# Patient Record
Sex: Male | Born: 1985
Health system: Southern US, Community
[De-identification: ages and names within clinical notes are randomized; demographics above are authoritative.]

## PROBLEM LIST (undated history)

## (undated) DIAGNOSIS — K219 Gastro-esophageal reflux disease without esophagitis: Secondary | ICD-10-CM

## (undated) DIAGNOSIS — E7800 Pure hypercholesterolemia, unspecified: Secondary | ICD-10-CM

## (undated) DIAGNOSIS — F32A Depression, unspecified: Secondary | ICD-10-CM

## (undated) DIAGNOSIS — F329 Major depressive disorder, single episode, unspecified: Secondary | ICD-10-CM

## (undated) DIAGNOSIS — E559 Vitamin D deficiency, unspecified: Secondary | ICD-10-CM

## (undated) DIAGNOSIS — E039 Hypothyroidism, unspecified: Secondary | ICD-10-CM

---

## 2014-04-14 HISTORY — PX: UPPER GASTROINTESTINAL ENDOSCOPY: SHX188

## 2014-05-03 ENCOUNTER — Encounter: Payer: Self-pay | Admitting: Internal Medicine

## 2017-12-28 ENCOUNTER — Encounter: Payer: Self-pay | Admitting: Pharmacist

## 2017-12-28 ENCOUNTER — Other Ambulatory Visit: Payer: Self-pay | Admitting: Pharmacist

## 2017-12-28 ENCOUNTER — Telehealth: Payer: Self-pay | Admitting: Pharmacist

## 2017-12-28 DIAGNOSIS — Z79899 Other long term (current) drug therapy: Secondary | ICD-10-CM

## 2017-12-28 MED ORDER — DUPILUMAB 300 MG/2ML ~~LOC~~ SOSY: 1.0000 | PREFILLED_SYRINGE | SUBCUTANEOUS | 11 refills | Status: DC

## 2017-12-28 NOTE — Telephone Encounter (Signed)
@  LOGO@  S: Patient presents to [facility] for review of their specialty medication therapy.  Patient is currently taking Dupixent for atopic dermatitis. Patient is managed by Kizzie Fantasia for this.   Adherence: missed last dose because he ran out  Efficacy: it was working well when he was able to take the medication  Dosing: 300 mg every 2 weeks  Dose adjustments: Renal: no dose adjustments (has not been studied) Hepatic: no dose adjustments (has not been studied)  Drug-drug interactions: none  Monitoring: Hypersensitivity: denies Ocular adverse effects: denies S/sx of infection: denies S/sx of eosinophilia: denies   O: @FLOWAMB (5)@   No results found for: WBC, HGB, HCT, MCV, PLT  @LASTCHEM @  A/P: 1. Medication review: Patient currently on [place] for eczema. Reviewed the medication with the patient, including the following:  Dupixent is a human monoclonal antibody that inhibits IL-4 when then prevents or decreases inflammatory response. Monitor for hypersensitivity, ocular adverse effects, eosinophilia, and increased risk of infection. Avoid live vaccinations. No recommendations for any changes.    Christella Hartigan, PharmD, BCPS, BCACP, [facility] [phone]

## 2017-12-28 NOTE — Progress Notes (Unsigned)
@  LOGO@  S: Patient presents to [facility] for review of their specialty medication therapy.  Patient is currently taking Dupixent for atopic dermatitis. Patient is managed by Kizzie Fantasia for this.   Adherence: missed last dose because he ran out  Efficacy: it was working well when he was able to take the medication  Dosing: 300 mg every 2 weeks  Dose adjustments: Renal: no dose adjustments (has not been studied) Hepatic: no dose adjustments (has not been studied)  Drug-drug interactions: none  Monitoring: Hypersensitivity: denies Ocular adverse effects: denies S/sx of infection: denies S/sx of eosinophilia: denies   O: @FLOWAMB (5)@   RecentLabs  No results found for: WBC, HGB, HCT, MCV, PLT    @LASTCHEM @  A/P: 1. Medication review: Patient currently on [place] for eczema. Reviewed the medication with the patient, including the following:  Dupixent is a human monoclonal antibody that inhibits IL-4 when then prevents or decreases inflammatory response. Monitor for hypersensitivity, ocular adverse effects, eosinophilia, and increased risk of infection. Avoid live vaccinations. No recommendations for any changes.    Christella Hartigan, PharmD, BCPS, BCACP, [facility] [phone]

## 2017-12-28 NOTE — Progress Notes (Signed)
@  LOGO@  S: Patient presents to [facility] for review of their specialty medication therapy.  Patient is currently taking Dupixent for atopic dermatitis. Patient is managed by Kizzie Fantasia for this.   Adherence: missed last dose because he ran out  Efficacy: it was working well when he was able to take the medication  Dosing: 300 mg every 2 weeks  Dose adjustments: Renal: no dose adjustments (has not been studied) Hepatic: no dose adjustments (has not been studied)  Drug-drug interactions: none  Monitoring: Hypersensitivity: denies Ocular adverse effects: denies S/sx of infection: denies S/sx of eosinophilia: denies   O: @FLOWAMB (5)@   RecentLabs  No results found for: WBC, HGB, HCT, MCV, PLT    @LASTCHEM @  A/P: 1. Medication review: Patient currently on [place] for eczema. Reviewed the medication with the patient, including the following:  Dupixent is a human monoclonal antibody that inhibits IL-4 when then prevents or decreases inflammatory response. Monitor for hypersensitivity, ocular adverse effects, eosinophilia, and increased risk of infection. Avoid live vaccinations. No recommendations for any changes.    Christella Hartigan, PharmD, BCPS, BCACP, [facility] [phone]

## 2018-01-18 MED FILL — BUPROPION HCL XL 300 MG TAB: 300 | 90 days supply | Qty: 90 | Fill #0 | Status: TO

## 2018-01-18 MED FILL — ESCITALOPRAM 10 MG TABLET: 10 | 30 days supply | Qty: 30 | Fill #0

## 2018-01-18 MED FILL — LEVOTHYROXINE 100 MCG TAB: 100 | 30 days supply | Qty: 30 | Fill #0

## 2018-01-27 DIAGNOSIS — M62830 Muscle spasm of back: Secondary | ICD-10-CM | POA: Diagnosis not present

## 2018-01-27 DIAGNOSIS — M542 Cervicalgia: Secondary | ICD-10-CM | POA: Diagnosis not present

## 2018-02-03 DIAGNOSIS — D2339 Other benign neoplasm of skin of other parts of face: Secondary | ICD-10-CM | POA: Diagnosis not present

## 2018-02-03 DIAGNOSIS — D485 Neoplasm of uncertain behavior of skin: Secondary | ICD-10-CM | POA: Diagnosis not present

## 2018-02-03 DIAGNOSIS — Z79899 Other long term (current) drug therapy: Secondary | ICD-10-CM | POA: Diagnosis not present

## 2018-02-03 DIAGNOSIS — L2089 Other atopic dermatitis: Secondary | ICD-10-CM | POA: Diagnosis not present

## 2018-04-16 MED FILL — BUPROPION HCL XL 300 MG TAB: 300 | 30 days supply | Qty: 30 | Fill #0

## 2018-04-21 DIAGNOSIS — Z0001 Encounter for general adult medical examination with abnormal findings: Secondary | ICD-10-CM | POA: Diagnosis not present

## 2018-04-21 DIAGNOSIS — E669 Obesity, unspecified: Secondary | ICD-10-CM | POA: Diagnosis not present

## 2018-04-21 DIAGNOSIS — E039 Hypothyroidism, unspecified: Secondary | ICD-10-CM | POA: Diagnosis not present

## 2018-04-21 DIAGNOSIS — K219 Gastro-esophageal reflux disease without esophagitis: Secondary | ICD-10-CM | POA: Diagnosis not present

## 2018-04-21 DIAGNOSIS — Z1389 Encounter for screening for other disorder: Secondary | ICD-10-CM | POA: Diagnosis not present

## 2018-04-21 DIAGNOSIS — F341 Dysthymic disorder: Secondary | ICD-10-CM | POA: Diagnosis not present

## 2018-04-21 MED FILL — OMEPRAZOLE 20 MG CAP: 20 | 45 days supply | Qty: 90 | Fill #0

## 2018-04-21 MED FILL — ESCITALOPRAM 10 MG TABLET: 10 | 90 days supply | Qty: 90 | Fill #0

## 2018-04-27 MED FILL — ROSUVASTATIN CALCIUM 10 MG: 10 | 90 days supply | Qty: 90 | Fill #0

## 2018-05-09 DIAGNOSIS — J329 Chronic sinusitis, unspecified: Secondary | ICD-10-CM | POA: Diagnosis not present

## 2018-05-09 DIAGNOSIS — R05 Cough: Secondary | ICD-10-CM | POA: Diagnosis not present

## 2018-05-09 DIAGNOSIS — R0602 Shortness of breath: Secondary | ICD-10-CM | POA: Diagnosis not present

## 2018-05-25 MED FILL — BUPROPION HCL XL 300 MG TAB: 300 | 90 days supply | Qty: 90 | Fill #0

## 2018-07-26 MED FILL — ESCITALOPRAM 10 MG TABLET: 10 | 90 days supply | Qty: 90 | Fill #1

## 2018-07-26 MED FILL — OMEPRAZOLE 20 MG CAP: 20 | 45 days supply | Qty: 90 | Fill #1

## 2018-07-26 MED FILL — ROSUVASTATIN CALCIUM 10 MG: 10 | 90 days supply | Qty: 90 | Fill #1

## 2018-08-08 DIAGNOSIS — E7800 Pure hypercholesterolemia, unspecified: Secondary | ICD-10-CM | POA: Diagnosis not present

## 2018-08-08 DIAGNOSIS — R072 Precordial pain: Secondary | ICD-10-CM | POA: Diagnosis not present

## 2018-08-08 DIAGNOSIS — Z882 Allergy status to sulfonamides status: Secondary | ICD-10-CM | POA: Diagnosis not present

## 2018-08-08 DIAGNOSIS — R079 Chest pain, unspecified: Secondary | ICD-10-CM | POA: Diagnosis not present

## 2018-08-09 DIAGNOSIS — Z882 Allergy status to sulfonamides status: Secondary | ICD-10-CM | POA: Diagnosis not present

## 2018-08-09 DIAGNOSIS — R079 Chest pain, unspecified: Secondary | ICD-10-CM | POA: Diagnosis not present

## 2018-08-09 DIAGNOSIS — E7800 Pure hypercholesterolemia, unspecified: Secondary | ICD-10-CM | POA: Diagnosis not present

## 2018-08-25 MED FILL — BuPROPion HCL ER (XL) 300 M: 300 | 90 days supply | Qty: 90 | Fill #1

## 2018-10-04 DIAGNOSIS — A084 Viral intestinal infection, unspecified: Secondary | ICD-10-CM | POA: Diagnosis not present

## 2018-10-18 DIAGNOSIS — E039 Hypothyroidism, unspecified: Secondary | ICD-10-CM | POA: Diagnosis not present

## 2018-10-18 DIAGNOSIS — E559 Vitamin D deficiency, unspecified: Secondary | ICD-10-CM | POA: Diagnosis not present

## 2018-10-18 DIAGNOSIS — R7989 Other specified abnormal findings of blood chemistry: Secondary | ICD-10-CM | POA: Diagnosis not present

## 2018-10-20 DIAGNOSIS — Z713 Dietary counseling and surveillance: Secondary | ICD-10-CM | POA: Diagnosis not present

## 2018-10-20 DIAGNOSIS — R197 Diarrhea, unspecified: Secondary | ICD-10-CM | POA: Diagnosis not present

## 2018-10-20 DIAGNOSIS — Z6832 Body mass index (BMI) 32.0-32.9, adult: Secondary | ICD-10-CM | POA: Diagnosis not present

## 2018-10-20 DIAGNOSIS — E781 Pure hyperglyceridemia: Secondary | ICD-10-CM | POA: Diagnosis not present

## 2018-10-20 DIAGNOSIS — E039 Hypothyroidism, unspecified: Secondary | ICD-10-CM | POA: Diagnosis not present

## 2018-10-21 MED FILL — LEVOTHYROXINE 112 MCG TAB: 112 | 30 days supply | Qty: 30 | Fill #0

## 2018-10-21 MED FILL — ESCITALOPRAM 10 MG TABLET: 10 | 30 days supply | Qty: 30 | Fill #0

## 2018-10-21 MED FILL — ROSUVASTATIN CALCIUM 10 MG: 10 | 90 days supply | Qty: 90 | Fill #0

## 2018-10-21 MED FILL — OMEPRAZOLE 20 MG CPDR: 20 | 30 days supply | Qty: 60 | Fill #0

## 2018-11-19 MED FILL — LEVOTHYROXINE 112 MCG TAB: 112 | 30 days supply | Qty: 30 | Fill #1

## 2018-11-19 MED FILL — buPROPion HCL ER (XL) 300 M: 300 | 30 days supply | Qty: 30 | Fill #0

## 2018-12-01 DIAGNOSIS — Z79899 Other long term (current) drug therapy: Secondary | ICD-10-CM | POA: Diagnosis not present

## 2018-12-01 DIAGNOSIS — L298 Other pruritus: Secondary | ICD-10-CM | POA: Diagnosis not present

## 2018-12-01 DIAGNOSIS — L2089 Other atopic dermatitis: Secondary | ICD-10-CM | POA: Diagnosis not present

## 2018-12-01 MED FILL — CLOBETASOL PROPIONATE 0.05: 0.05 | 30 days supply | Qty: 1 | Fill #0

## 2018-12-21 MED FILL — buPROPion HCL ER (XL) 300 M: 300 | 30 days supply | Qty: 30 | Fill #1

## 2018-12-21 MED FILL — LEVOTHYROXINE 112 MCG TAB: 112 | 30 days supply | Qty: 30 | Fill #2

## 2018-12-21 MED FILL — OMEPRAZOLE 20 MG CPDR: 20 | 30 days supply | Qty: 60 | Fill #1

## 2018-12-28 ENCOUNTER — Emergency Department (HOSPITAL_COMMUNITY): Payer: 59

## 2018-12-28 ENCOUNTER — Other Ambulatory Visit: Payer: Self-pay

## 2018-12-28 ENCOUNTER — Encounter (HOSPITAL_COMMUNITY): Payer: Self-pay | Admitting: *Deleted

## 2018-12-28 ENCOUNTER — Emergency Department (HOSPITAL_COMMUNITY)
Admission: EM | Admit: 2018-12-28 | Discharge: 2018-12-29 | Disposition: A | Discharge status: Home / Self Care | Payer: 59 | Attending: Emergency Medicine | Admitting: Emergency Medicine

## 2018-12-28 DIAGNOSIS — R1031 Right lower quadrant pain: Secondary | ICD-10-CM

## 2018-12-28 DIAGNOSIS — A084 Viral intestinal infection, unspecified: Secondary | ICD-10-CM | POA: Insufficient documentation

## 2018-12-28 DIAGNOSIS — E039 Hypothyroidism, unspecified: Secondary | ICD-10-CM | POA: Diagnosis not present

## 2018-12-28 DIAGNOSIS — Z79899 Other long term (current) drug therapy: Secondary | ICD-10-CM | POA: Diagnosis not present

## 2018-12-28 HISTORY — DX: Depression, unspecified: F32.A

## 2018-12-28 HISTORY — DX: Vitamin D deficiency, unspecified: E55.9

## 2018-12-28 HISTORY — DX: Major depressive disorder, single episode, unspecified: F32.9

## 2018-12-28 HISTORY — DX: Pure hypercholesterolemia, unspecified: E78.00

## 2018-12-28 HISTORY — DX: Hypothyroidism, unspecified: E03.9

## 2018-12-28 HISTORY — DX: Gastro-esophageal reflux disease without esophagitis: K21.9

## 2018-12-28 LAB — URINALYSIS, ROUTINE W REFLEX MICROSCOPIC
Bilirubin Urine: NEGATIVE
Glucose, UA: NEGATIVE mg/dL
Hgb urine dipstick: NEGATIVE
Ketones, ur: NEGATIVE mg/dL
Leukocytes, UA: NEGATIVE
NITRITE: NEGATIVE
Protein, ur: NEGATIVE mg/dL
Specific Gravity, Urine: 1.021 (ref 1.005–1.030)
pH: 6 (ref 5.0–8.0)

## 2018-12-28 LAB — CBC
HCT: 44.2 % (ref 39.0–52.0)
Hemoglobin: 14.6 g/dL (ref 13.0–17.0)
MCH: 28.2 pg (ref 26.0–34.0)
MCHC: 33 g/dL (ref 30.0–36.0)
MCV: 85.5 fL (ref 80.0–100.0)
Platelets: 279 10*3/uL (ref 150–400)
RBC: 5.17 MIL/uL (ref 4.22–5.81)
RDW: 12 % (ref 11.5–15.5)
WBC: 8.8 10*3/uL (ref 4.0–10.5)
nRBC: 0 % (ref 0.0–0.2)

## 2018-12-28 LAB — COMPREHENSIVE METABOLIC PANEL
ALT: 34 U/L (ref 0–44)
AST: 24 U/L (ref 15–41)
Albumin: 4.8 g/dL (ref 3.5–5.0)
Alkaline Phosphatase: 70 U/L (ref 38–126)
Anion gap: 7 (ref 5–15)
BUN: 14 mg/dL (ref 6–20)
CHLORIDE: 103 mmol/L (ref 98–111)
CO2: 26 mmol/L (ref 22–32)
CREATININE: 1.04 mg/dL (ref 0.61–1.24)
Calcium: 9.3 mg/dL (ref 8.9–10.3)
Glucose, Bld: 98 mg/dL (ref 70–99)
POTASSIUM: 4.1 mmol/L (ref 3.5–5.1)
SODIUM: 136 mmol/L (ref 135–145)
Total Bilirubin: 0.6 mg/dL (ref 0.3–1.2)
Total Protein: 7.5 g/dL (ref 6.5–8.1)

## 2018-12-28 LAB — LIPASE, BLOOD: LIPASE: 31 U/L (ref 11–51)

## 2018-12-28 MED ORDER — IOHEXOL 300 MG/ML  SOLN
100.0000 mL | Freq: Once | INTRAMUSCULAR | Status: AC | PRN
  Administered 2018-12-29: 100 mL via INTRAVENOUS

## 2018-12-28 MED ORDER — ONDANSETRON HCL 4 MG/2ML IJ SOLN
4.0000 mg | Freq: Once | INTRAMUSCULAR | Status: AC
  Administered 2018-12-29: 4 mg via INTRAVENOUS
  Filled 2018-12-28: qty 2

## 2018-12-28 MED ORDER — MORPHINE SULFATE (PF) 4 MG/ML IV SOLN
4.0000 mg | Freq: Once | INTRAVENOUS | Status: AC
  Administered 2018-12-29: 4 mg via INTRAVENOUS
  Filled 2018-12-28: qty 1

## 2018-12-28 NOTE — ED Triage Notes (Signed)
Pt c/o lower right abdominal pain and states the pain is worse after he eats; pt describes the pain as sharp and throbbing; pt states he woke up this am with the pain; pt states he has had n/d but no vomiting

## 2018-12-29 DIAGNOSIS — E039 Hypothyroidism, unspecified: Secondary | ICD-10-CM | POA: Diagnosis not present

## 2018-12-29 DIAGNOSIS — A084 Viral intestinal infection, unspecified: Secondary | ICD-10-CM | POA: Diagnosis not present

## 2018-12-29 DIAGNOSIS — Z79899 Other long term (current) drug therapy: Secondary | ICD-10-CM | POA: Diagnosis not present

## 2018-12-29 DIAGNOSIS — R1031 Right lower quadrant pain: Secondary | ICD-10-CM | POA: Diagnosis not present

## 2018-12-29 MED ORDER — DICYCLOMINE HCL 10 MG PO CAPS
20.0000 mg | ORAL_CAPSULE | Freq: Once | ORAL | Status: AC
  Administered 2018-12-29: 20 mg via ORAL
  Filled 2018-12-29: qty 2

## 2018-12-29 MED ORDER — DICYCLOMINE HCL 20 MG PO TABS: 20.0000 mg | ORAL_TABLET | Freq: Two times a day (BID) | ORAL | 0 refills | Status: DC | PRN

## 2018-12-29 NOTE — Discharge Instructions (Addendum)
Your CT scan and labs tonight are stable, I suspect you have a viral infection causing your abdominal pain and diarrhea.  Make sure you are drinking plenty of fluids, maintain a bland diet as discussed.  You may continue to take the Bentyl to help with cramping and pain.  I would allow the diarrhea to run its course as discussed, unless it becomes much more frequent such as diarrhea every hour which can result in significant dehydration and electrolyte abnormalities.  Obtain follow-up care as discussed and as recommended below.

## 2018-12-29 NOTE — ED Provider Notes (Signed)
[facility] EMERGENCY DEPARTMENT Provider Note   CSN: [id number] Arrival date & time: 12/28/18  2009     History   Chief Complaint Chief Complaint  Patient presents with  . Abdominal Pain    HPI William Lucero is a 33 y.o. male with a history significant for GERD, presenting with a one day history of right sided abdominal pain in association with 3-4 episodes of nonbloody diarrhea.  He woke this am with waxing/waning sharp and crampy pain in his right upper and lower abdomen which is worsened with movement and palpation but constant even at rest.  He denies n/v or anorexia and has been able to maintain PO intake.  Denies fevers, chills, dysuria, hematuria, flank pain.  He has had no tx prior to arrival.  The history is provided by the patient.    Past Medical History:  Diagnosis Date  . Acid reflux   . Depression   . Hypercholesteremia   . Hypothyroidism   . Vitamin D deficiency     There are no active problems to display for this patient.   History reviewed. No pertinent surgical history.      Home Medications    Prior to Admission medications   Medication Sig Start Date End Date Taking? Authorizing Provider  buPROPion (WELLBUTRIN XL) 300 MG 24 hr tablet Take 300 mg by mouth daily.   Yes [provider]  Cholecalciferol (DIALYVITE VITAMIN D 5000) 125 MCG (5000 UT) capsule Take 5,000 Units by mouth daily.   Yes [provider]  clobetasol cream (TEMOVATE) 3.22 % Apply 1 application topically daily as needed (skin irritation).   Yes [provider]  escitalopram (LEXAPRO) 10 MG tablet Take 10 mg by mouth daily.   Yes [provider]  Lactobacillus (ACIDOPHILUS PO) Take 1 capsule by mouth daily.   Yes [provider]  levothyroxine (SYNTHROID, LEVOTHROID) 112 MCG tablet Take 112 mcg by mouth daily before breakfast.   Yes [provider]  Multiple Vitamin (MULTIVITAMIN WITH MINERALS) TABS tablet Take 1 tablet by mouth  daily.   Yes [provider]  niacin 500 MG tablet Take 500 mg by mouth 2 (two) times daily with a meal.   Yes [provider]  omeprazole (PRILOSEC) 20 MG capsule Take 20 mg by mouth daily.   Yes [provider]  dicyclomine (BENTYL) 20 MG tablet Take 1 tablet (20 mg total) by mouth 2 (two) times daily as needed for spasms. 12/29/18   Evalee Jefferson, PA-C    Family History History reviewed. No pertinent family history.  Social History Social History   Tobacco Use  . Smoking status: Never Smoker  . Smokeless tobacco: Never Used  Substance Use Topics  . Alcohol use: Yes    Comment: socially  . Drug use: Never     Allergies   Sulfa antibiotics   Review of Systems Review of Systems  Constitutional: Negative for chills and fever.  HENT: Negative for congestion and sore throat.   Eyes: Negative.   Respiratory: Negative for chest tightness and shortness of breath.   Cardiovascular: Negative for chest pain.  Gastrointestinal: Positive for abdominal pain and diarrhea. Negative for blood in stool, nausea and vomiting.  Genitourinary: Negative.   Musculoskeletal: Negative for arthralgias, joint swelling and neck pain.  Skin: Negative.  Negative for rash and wound.  Neurological: Negative for dizziness, weakness, light-headedness, numbness and headaches.  Psychiatric/Behavioral: Negative.      Physical Exam Updated Vital Signs BP 130/80  Pulse 78   Temp 98.2 F (36.8 C) (Oral)   Resp 17   Ht 5\' 10"  (1.778 m)   Wt 99.8 kg   SpO2 97%   BMI 31.57 kg/m   Physical Exam Vitals signs and nursing note reviewed.  Constitutional:      Appearance: He is well-developed.  HENT:     Head: Normocephalic and atraumatic.  Eyes:     Conjunctiva/sclera: Conjunctivae normal.  Neck:     Musculoskeletal: Normal range of motion.  Cardiovascular:     Rate and Rhythm: Normal rate and regular rhythm.     Heart sounds: Normal heart sounds.  Pulmonary:      Effort: Pulmonary effort is normal.     Breath sounds: Normal breath sounds. No wheezing.  Abdominal:     General: Bowel sounds are normal.     Palpations: Abdomen is soft.     Tenderness: There is abdominal tenderness in the right upper quadrant and right lower quadrant. There is no right CVA tenderness, left CVA tenderness or rebound. Positive signs include McBurney's sign. Negative signs include psoas sign.  Musculoskeletal: Normal range of motion.  Skin:    General: Skin is warm and dry.  Neurological:     Mental Status: He is alert.      ED Treatments / Results  Labs (all labs ordered are listed, but only abnormal results are displayed) Labs Reviewed  LIPASE, BLOOD  COMPREHENSIVE METABOLIC PANEL  CBC  URINALYSIS, ROUTINE W REFLEX MICROSCOPIC    EKG None  Radiology Ct Abdomen Pelvis W Contrast  Result Date: 12/29/2018 CLINICAL DATA:  Right lower abdominal pain worse after eating. EXAM: CT ABDOMEN AND PELVIS WITH CONTRAST TECHNIQUE: Multidetector CT imaging of the abdomen and pelvis was performed using the standard protocol following bolus administration of intravenous contrast. CONTRAST:  143mL OMNIPAQUE IOHEXOL 300 MG/ML  SOLN COMPARISON:  None. FINDINGS: Lower chest: Atelectasis in the lung bases. Hepatobiliary: No focal liver abnormality is seen. No gallstones, gallbladder wall thickening, or biliary dilatation. Pancreas: Unremarkable. No pancreatic ductal dilatation or surrounding inflammatory changes. Spleen: Normal in size without focal abnormality. Adrenals/Urinary Tract: Adrenal glands are unremarkable. Kidneys are normal, without renal calculi, focal lesion, or hydronephrosis. Bladder is unremarkable. Stomach/Bowel: Stomach is within normal limits. Appendix appears normal. No evidence of bowel wall thickening, distention, or inflammatory changes. Vascular/Lymphatic: No significant vascular findings are present. No enlarged abdominal or pelvic lymph nodes. Reproductive:  Prostate is unremarkable. Other: No abdominal wall hernia or abnormality. No abdominopelvic ascites. Musculoskeletal: No acute or significant osseous findings. IMPRESSION: No acute process demonstrated in the abdomen or pelvis. No evidence of bowel obstruction or inflammation. Electronically Signed   By: Lucienne Capers M.D.   On: 12/29/2018 00:51    Procedures Procedures (including critical care time)  Medications Ordered in ED Medications  ondansetron (ZOFRAN) injection 4 mg (4 mg Intravenous Given 12/29/18 0011)  morphine 4 MG/ML injection 4 mg (4 mg Intravenous Given 12/29/18 0012)  iohexol (OMNIPAQUE) 300 MG/ML solution 100 mL (100 mLs Intravenous Contrast Given 12/29/18 0031)  dicyclomine (BENTYL) capsule 20 mg (20 mg Oral Given 12/29/18 0132)     Initial Impression / Assessment and Plan / ED Course  I have reviewed the triage vital signs and the nursing notes.  Pertinent labs & imaging results that were available during my care of the patient were reviewed by me and considered in my medical decision making (see chart for details).     Patient has normal labs and CT  imaging reviewed and also normal.  I suspect this is a viral enteritis.  He has had no diarrhea during his visit here and he tolerated p.o. intake without increased symptoms.  He was prescribed Bentyl for cramping relief.  Discussed bland diet and close follow-up with his PCP if symptoms persist, return here for any worsening symptoms.  He expressed concerns over chronic intermittent GI complaints including BMs triggered by any food intake which is been a symptom for many years.  I referred him to GI for further evaluation of his chronic symptoms.  The patient appears reasonably screened and/or stabilized for discharge and I doubt any other medical condition or other [facility] requiring further screening, evaluation, or treatment in the ED at this time prior to discharge.   Final Clinical Impressions(s) / ED Diagnoses   Final  diagnoses:  Viral enteritis  Right lower quadrant abdominal pain    ED Discharge Orders         Ordered    dicyclomine (BENTYL) 20 MG tablet  2 times daily PRN     12/29/18 0117           Evalee Jefferson, PA-C 12/30/18 1042    Rancour, Annie Main, MD 12/30/18 1707

## 2019-01-18 ENCOUNTER — Encounter (INDEPENDENT_AMBULATORY_CARE_PROVIDER_SITE_OTHER): Payer: Self-pay | Admitting: Internal Medicine

## 2019-01-18 ENCOUNTER — Encounter (INDEPENDENT_AMBULATORY_CARE_PROVIDER_SITE_OTHER): Payer: Self-pay | Admitting: *Deleted

## 2019-01-18 ENCOUNTER — Ambulatory Visit (INDEPENDENT_AMBULATORY_CARE_PROVIDER_SITE_OTHER): Payer: 59 | Admitting: Internal Medicine

## 2019-01-18 VITALS — BP 129/85 | HR 92 | Temp 98.1°F | Resp 18 | Ht 71.0 in | Wt 225.4 lb

## 2019-01-18 DIAGNOSIS — R1011 Right upper quadrant pain: Secondary | ICD-10-CM | POA: Diagnosis not present

## 2019-01-18 DIAGNOSIS — K219 Gastro-esophageal reflux disease without esophagitis: Secondary | ICD-10-CM | POA: Diagnosis not present

## 2019-01-18 DIAGNOSIS — K580 Irritable bowel syndrome with diarrhea: Secondary | ICD-10-CM

## 2019-01-18 MED ORDER — PANTOPRAZOLE SODIUM 40 MG PO TBEC: 40.0000 mg | DELAYED_RELEASE_TABLET | Freq: Every day | ORAL | 5 refills | Status: DC

## 2019-01-18 MED ORDER — DICYCLOMINE HCL 10 MG PO CAPS: 10.0000 mg | ORAL_CAPSULE | Freq: Three times a day (TID) | ORAL | 2 refills | Status: DC

## 2019-01-18 MED FILL — PANTOPRAZOLE SOD DR 40 MG T: 40 | 30 days supply | Qty: 30 | Fill #0

## 2019-01-18 MED FILL — DICYCLOMINE 10 MG CAPSULE: 10 | 30 days supply | Qty: 90 | Fill #0

## 2019-01-18 NOTE — Patient Instructions (Signed)
Physician will call with results of ultrasound when completed. 

## 2019-01-18 NOTE — Progress Notes (Signed)
Presenting complaint;  Right upper quadrant pain nausea diarrhea and heartburn.  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 He states he has had right-sided abdominal pain predominantly under the right rib cage off and on for more than 5 years.  He also has had frequent heartburn.  Regarding his pain he underwent esophagogastroduodenoscopy by Dr. Guinevere Ferrari of [place] on 05/03/2014.  He was noted to have tongues of red mucosa above GE junction.  Biopsy revealed changes of reflux esophagitis but no Barrett's.  He also had duodenal erosions and biopsy was taken for CLOtest.  It is presumed that CLOtest was negative.  He states he is never been treated for H. pylori infection. He woke up with right-sided abdominal pain both in right upper quadrant right lower quadrant on 12/28/2018.  He had nausea but no vomiting.  As the pain persisted he was seen in emergency room.  CBC and comprehensive chemistry panels were normal.  Serum lipase was 31.  Urine analysis was unremarkable.  He underwent abdominal pelvic CT with contrast.  This study revealed no abnormality to account for his symptoms.  He was given prescription for dicyclomine but he has not tried it yet. He continues to complain of pain in the right upper quadrant which at times radiates into his chest.  He feels this pain like a gas pain and other times it is burning and pressure.  He has nausea.  He is unable to eat.  Pain at times has radiating to back as well.  He is never undergone ultrasound looking for gallstones.  Lately he has had daily heartburn in spite of taking omeprazole.  He also complains of postprandial diarrhea.  He may have 1 or 2 episodes of nocturnal bowel movement per month.  He has not experienced melena or rectal bleeding.  He is not sure if he has lost any weight over the last few weeks but over the last 5 years he has gained close to 25 pounds. He does not take  OTC NSAIDs.   Current Medications: Outpatient Encounter Medications as of 01/18/2019  Medication Sig  . buPROPion (WELLBUTRIN XL) 300 MG 24 hr tablet Take 300 mg by mouth daily.  . Cholecalciferol (DIALYVITE VITAMIN D 5000) 125 MCG (5000 UT) capsule Take 5,000 Units by mouth daily.  . clobetasol cream (TEMOVATE) 0.34 % Apply 1 application topically daily as needed (skin irritation).  Marland Kitchen escitalopram (LEXAPRO) 10 MG tablet Take 10 mg by mouth daily.  . Lactobacillus (ACIDOPHILUS PO) Take 1 capsule by mouth daily.  Marland Kitchen levothyroxine (SYNTHROID, LEVOTHROID) 112 MCG tablet Take 112 mcg by mouth daily before breakfast.  . Multiple Vitamin (MULTIVITAMIN WITH MINERALS) TABS tablet Take 1 tablet by mouth daily.  . niacin 500 MG tablet Take 500 mg by mouth 2 (two) times daily with a meal.  . omeprazole (PRILOSEC) 20 MG capsule Take 20 mg by mouth daily.  Marland Kitchen dicyclomine (BENTYL) 20 MG tablet Take 1 tablet (20 mg total) by mouth 2 (two) times daily as needed for spasms. (Patient not taking: Reported on 01/18/2019)   No facility-administered encounter medications on file as of 01/18/2019.    Past medical history;  Chronic GERD.  He had a EGD in May 2015 revealing changes of reflux esophagitis on biopsy and CLOtest was performed and presumably was negative. Hypothyroidism was diagnosed in 2016. Low vitamin D level noted in May 2019. Hyperlipidemia diagnosed in November 2019.  He has been  on niacin.   Allergies;  Allergies  Allergen Reactions  . Sulfa Antibiotics Other (See Comments)    Causes a fever     Family history;  Father has hypothyroidism and diabetes mellitus.  He is 46 years old. Mother has hypertension and had cholecystectomy for cholelithiasis.  She also has A. fib.  She is 51 years old. His sister age 39 and brother age 15 are both in good health. Patient states gallbladder disease is very common on mother side of his family.  His maternal grandmother had gallbladder surgery and so did  his maternal aunt.  Social history;  He is married.  He has 1 son age 59 months in good health.  He has never smoked cigarettes and drinks alcohol occasionally.  He does not exercise or walk regularly.  He states he walks an average of 5000 steps per day.  Physical examination:  Blood pressure 129/85, pulse 92, temperature 98.1 F (36.7 C), temperature source Oral, resp. rate 18, height _0  (1.803 m), weight 225 lb 6.4 oz (102.2 kg). Patient is alert and in no acute distress. Conjunctiva is pink. Sclera is nonicteric Oropharyngeal mucosa is normal. No neck masses or thyromegaly noted. Cardiac exam with regular rhythm normal S1 and S2. No murmur or gallop noted. Lungs are clear to auscultation. Abdomen is full.  It is symmetrical.  On palpation abdomen is soft.  He has mild tenderness below the right costal margin and right mid abdomen.  No organomegaly or masses. No LE edema or clubbing noted.  Labs/studies Results:  CBC Latest Ref Rng & Units 12/28/2018  WBC 4.0 - 10.5 K/uL 8.8  Hemoglobin 13.0 - 17.0 g/dL 14.6  Hematocrit 39.0 - 52.0 % 44.2  Platelets 150 - 400 K/uL 279    CMP Latest Ref Rng & Units 12/28/2018  Glucose 70 - 99 mg/dL 98  BUN 6 - 20 mg/dL 14  Creatinine 0.61 - 1.24 mg/dL 1.04  Sodium 135 - 145 mmol/L 136  Potassium 3.5 - 5.1 mmol/L 4.1  Chloride 98 - 111 mmol/L 103  CO2 22 - 32 mmol/L 26  Calcium 8.9 - 10.3 mg/dL 9.3  Total Protein 6.5 - 8.1 g/dL 7.5  Total Bilirubin 0.3 - 1.2 mg/dL 0.6  Alkaline Phos 38 - 126 U/L 70  AST 15 - 41 U/L 24  ALT 0 - 44 U/L 34    Hepatic Function Latest Ref Rng & Units 12/28/2018  Total Protein 6.5 - 8.1 g/dL 7.5  Albumin 3.5 - 5.0 g/dL 4.8  AST 15 - 41 U/L 24  ALT 0 - 44 U/L 34  Alk Phosphatase 38 - 126 U/L 70  Total Bilirubin 0.3 - 1.2 mg/dL 0.6    Abdominopelvic CT reviewed.  Assessment:  #1.  Right upper quadrant abdominal pain of few years duration but he had an episode of significant pain leading to ER visit but  work-up was negative including CBC comprehensive chemistry panel serum lipase and abdominal pelvic CT.  Pain is associated with nausea and radiates into his chest and back.  Therefore gallbladder disease is quite likely.  He was begun on niacin few months ago for hyperlipidemia and this may also be contributing to his symptoms.  #2.  Chronic GERD.  Omeprazole is not working anymore.  He had a EGD in May 2015 revealing changes of reflux esophagitis on biopsy.  PPI needs to be changed.  #3.  Postprandial diarrhea.  Symptoms are typical of irritable bowel syndrome.  Plan:  Discontinue omeprazole.  Discontinue niacin for the time being. Begin pantoprazole 40 mg by mouth 30 minutes before breakfast daily. Dicyclomine 10 mg by mouth 30 minutes before each meal. Schedule upper abdominal ultrasound. Further recommendations to follow.

## 2019-01-24 MED FILL — LEVOTHYROXINE 112 MCG TAB: 112 | 30 days supply | Qty: 30 | Fill #3

## 2019-01-24 MED FILL — buPROPion HCL ER (XL) 300 M: 300 | 30 days supply | Qty: 30 | Fill #2

## 2019-01-25 ENCOUNTER — Ambulatory Visit (HOSPITAL_COMMUNITY): Payer: 59

## 2019-01-31 ENCOUNTER — Ambulatory Visit (HOSPITAL_COMMUNITY)
Admission: RE | Admit: 2019-01-31 | Discharge: 2019-01-31 | Disposition: A | Discharge status: Home / Self Care | Payer: 59 | Source: Ambulatory Visit | Attending: Internal Medicine | Admitting: Internal Medicine

## 2019-01-31 DIAGNOSIS — R1011 Right upper quadrant pain: Secondary | ICD-10-CM | POA: Insufficient documentation

## 2019-01-31 DIAGNOSIS — E039 Hypothyroidism, unspecified: Secondary | ICD-10-CM | POA: Diagnosis not present

## 2019-01-31 DIAGNOSIS — E781 Pure hyperglyceridemia: Secondary | ICD-10-CM | POA: Diagnosis not present

## 2019-01-31 DIAGNOSIS — Z1389 Encounter for screening for other disorder: Secondary | ICD-10-CM | POA: Diagnosis not present

## 2019-01-31 DIAGNOSIS — K760 Fatty (change of) liver, not elsewhere classified: Secondary | ICD-10-CM | POA: Diagnosis not present

## 2019-01-31 LAB — TSH: TSH: 7.24 — AB (ref 0.41–5.90)

## 2019-01-31 LAB — LIPID PANEL
Cholesterol: 176 (ref 0–200)
HDL: 25 — AB (ref 35–70)
Triglycerides: 742 — AB (ref 40–160)

## 2019-02-01 MED FILL — LEVOTHYROXINE 125 MCG TAB: 125 | 90 days supply | Qty: 90 | Fill #0

## 2019-02-09 DIAGNOSIS — Z125 Encounter for screening for malignant neoplasm of prostate: Secondary | ICD-10-CM | POA: Diagnosis not present

## 2019-02-09 DIAGNOSIS — E781 Pure hyperglyceridemia: Secondary | ICD-10-CM | POA: Diagnosis not present

## 2019-02-09 DIAGNOSIS — E291 Testicular hypofunction: Secondary | ICD-10-CM | POA: Diagnosis not present

## 2019-02-09 DIAGNOSIS — E669 Obesity, unspecified: Secondary | ICD-10-CM | POA: Diagnosis not present

## 2019-02-09 DIAGNOSIS — Z713 Dietary counseling and surveillance: Secondary | ICD-10-CM | POA: Diagnosis not present

## 2019-02-09 DIAGNOSIS — R7989 Other specified abnormal findings of blood chemistry: Secondary | ICD-10-CM | POA: Diagnosis not present

## 2019-02-09 DIAGNOSIS — E039 Hypothyroidism, unspecified: Secondary | ICD-10-CM | POA: Diagnosis not present

## 2019-02-09 DIAGNOSIS — R6882 Decreased libido: Secondary | ICD-10-CM | POA: Diagnosis not present

## 2019-02-09 DIAGNOSIS — Z6832 Body mass index (BMI) 32.0-32.9, adult: Secondary | ICD-10-CM | POA: Diagnosis not present

## 2019-02-09 DIAGNOSIS — Z1389 Encounter for screening for other disorder: Secondary | ICD-10-CM | POA: Diagnosis not present

## 2019-02-09 MED FILL — FENOFIBRATE 120 MG TABLET: 120 | 90 days supply | Qty: 90 | Fill #0 | Status: TO

## 2019-02-18 MED FILL — PANTOPRAZOLE SOD DR 40 MG T: 40 | 30 days supply | Qty: 30 | Fill #1 | Status: TO

## 2019-02-22 MED FILL — buPROPion HCL ER (XL) 300 M: 300 | 30 days supply | Qty: 30 | Fill #3 | Status: TO

## 2019-03-15 ENCOUNTER — Ambulatory Visit: Payer: Self-pay | Admitting: "Endocrinology

## 2019-03-15 DIAGNOSIS — R6889 Other general symptoms and signs: Secondary | ICD-10-CM | POA: Diagnosis not present

## 2019-03-18 MED FILL — PANTOPRAZOLE SOD DR 40 MG T: 40 | 90 days supply | Qty: 90 | Fill #0

## 2019-03-18 MED FILL — buPROPion HCL ER (XL) 300 M: 300 | 30 days supply | Qty: 30 | Fill #0

## 2019-03-23 DIAGNOSIS — E781 Pure hyperglyceridemia: Secondary | ICD-10-CM | POA: Diagnosis not present

## 2019-03-23 DIAGNOSIS — E039 Hypothyroidism, unspecified: Secondary | ICD-10-CM | POA: Diagnosis not present

## 2019-04-28 MED FILL — buPROPion HCL ER (XL) 300 M: 300 | 30 days supply | Qty: 30 | Fill #1

## 2019-04-28 MED FILL — LEVOTHYROXINE 125 MCG TAB: 125 | 90 days supply | Qty: 90 | Fill #0

## 2019-05-03 ENCOUNTER — Ambulatory Visit (INDEPENDENT_AMBULATORY_CARE_PROVIDER_SITE_OTHER): Payer: 59 | Admitting: Internal Medicine

## 2019-05-05 ENCOUNTER — Ambulatory Visit (INDEPENDENT_AMBULATORY_CARE_PROVIDER_SITE_OTHER): Payer: 59 | Admitting: "Endocrinology

## 2019-05-05 ENCOUNTER — Other Ambulatory Visit: Payer: Self-pay

## 2019-05-05 ENCOUNTER — Encounter (INDEPENDENT_AMBULATORY_CARE_PROVIDER_SITE_OTHER): Payer: Self-pay | Admitting: Internal Medicine

## 2019-05-05 ENCOUNTER — Ambulatory Visit (INDEPENDENT_AMBULATORY_CARE_PROVIDER_SITE_OTHER): Payer: 59 | Admitting: Internal Medicine

## 2019-05-05 ENCOUNTER — Encounter: Payer: Self-pay | Admitting: "Endocrinology

## 2019-05-05 VITALS — BP 126/82 | HR 72 | Ht 71.0 in | Wt 230.0 lb

## 2019-05-05 VITALS — BP 123/87 | HR 92 | Temp 98.2°F | Resp 18 | Ht 71.0 in | Wt 228.8 lb

## 2019-05-05 DIAGNOSIS — E291 Testicular hypofunction: Secondary | ICD-10-CM | POA: Diagnosis not present

## 2019-05-05 DIAGNOSIS — R1011 Right upper quadrant pain: Secondary | ICD-10-CM

## 2019-05-05 DIAGNOSIS — K219 Gastro-esophageal reflux disease without esophagitis: Secondary | ICD-10-CM | POA: Diagnosis not present

## 2019-05-05 DIAGNOSIS — E039 Hypothyroidism, unspecified: Secondary | ICD-10-CM | POA: Insufficient documentation

## 2019-05-05 MED ORDER — PANTOPRAZOLE SODIUM 40 MG PO TBEC: 40.0000 mg | DELAYED_RELEASE_TABLET | Freq: Every day | ORAL | 5 refills | Status: DC

## 2019-05-05 NOTE — Patient Instructions (Signed)
Notify if you have another episode of moderate to severe pain

## 2019-05-05 NOTE — Progress Notes (Signed)
Presenting complaint;  Follow-up for GERD and abdominal pain.  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  He was last seen on 01/18/2019.  He has a history of GERD for at least 5 years.  He reported poor symptomatic control with omeprazole and he was switched to pantoprazole.  He also has been experiencing pain in right upper quadrant radiating posteriorly as well as in the chest.  He has had few episodes of severe pain.  He was seen in emergency room on 12/28/2018 and work-up was unremarkable.  Following his last visit he had upper abdominal ultrasound and was negative for cholelithiasis.  Patient feels much better.  He states he has had very few episodes of heartburn since he has been on pantoprazole.  May be once every 4 weeks.  He states he has changed his diet completely.  He has quit drinking sodas.  He is now drinking primarily unsweetened tea.  He is not having any side effects with PPI.  He has not had any episode of pain in right upper quadrant or chest since his last visit.  He also states he tries to be up at least 2 hours after his evening meal.  He denies melena or rectal bleeding.  He has had few episodes of loose stool. His appetite is good and his weight has been stable.    Current Medications: Outpatient Encounter Medications as of 05/05/2019  Medication Sig  . buPROPion (WELLBUTRIN XL) 300 MG 24 hr tablet Take 300 mg by mouth daily.  . Cholecalciferol (VITAMIN D3 PO) Take 5,000 Units by mouth daily.  . fenofibrate (TRICOR) 145 MG tablet Take 145 mg by mouth daily.  Marland Kitchen levocetirizine (XYZAL) 5 MG tablet Take 5 mg by mouth every evening.  Marland Kitchen levothyroxine (SYNTHROID) 125 MCG tablet Take 125 mcg by mouth daily before breakfast.   . Multiple Vitamin (MULTIVITAMIN WITH MINERALS) TABS tablet Take 1 tablet by mouth daily.  . niacin 500 MG tablet Take 500 mg by mouth 2 (two) times a day.  . Omega-3 Fatty Acids (FISH OIL) 1000 MG  CPDR Take by mouth daily.  . pantoprazole (PROTONIX) 40 MG tablet Take 1 tablet (40 mg total) by mouth daily before breakfast.  . [DISCONTINUED] Vitamin D, Ergocalciferol, (DRISDOL) 1.25 MG (50000 UT) CAPS capsule Take 50,000 Units by mouth every 7 (seven) days.   No facility-administered encounter medications on file as of 05/05/2019.      Objective: Blood pressure 123/87, pulse 92, temperature 98.2 F (36.8 C), temperature source Oral, resp. rate 18, height 5\' 11"  (1.803 m), weight 228 lb 12.8 oz (103.8 kg). Patient is alert and in no acute distress. Conjunctiva is pink. Sclera is nonicteric Oropharyngeal mucosa is normal. No neck masses or thyromegaly noted. Cardiac exam with regular rhythm normal S1 and S2. No murmur or gallop noted. Lungs are clear to auscultation. Abdomen is symmetrical soft and nontender with organomegaly or masses. No LE edema or clubbing noted.  Labs/studies Results:  Abdominal ultrasound on 01/31/2019 was negative for cholelithiasis but revealed fatty liver. AST and ALT were normal on 12/28/2018.  Assessment:  #1.  Chronic GERD.  William Lucero is doing very well with pantoprazole as well as dietary and lifestyle modifications.  He is not having any side effects with this medication.  Down the road dose could be changed.  #2.  History of right upper quadrant abdominal pain.  Recent work-up has been negative including CT in January 2020  ultrasound and February 2020 and his lab studies are unremarkable.  He certainly could have gallbladder disease in spite of negative studies.  Less common condition would be pancreas divisum.  If he has another episode of significant pain will pursue with further work-up.   Plan:  Continue pantoprazole at 40 mg by mouth 30 minutes before breakfast daily. Patient will call if he has another episode of right upper quadrant pain in which case we will proceed with blood work to include serum amylase lipase and comprehensive chemistry  panel. Pantoprazole prescription sent to patient's pharmacy. Office visit in 6 months.

## 2019-05-05 NOTE — Progress Notes (Signed)
Endocrinology Consult Note   William Lucero, 33 y.o., male   Chief Complaint  Patient presents with  . Establish Care    ABN TFT, ABN Testosterone, High Triglycerides     Past Medical History:  Diagnosis Date  . Acid reflux   . Depression   . Hypercholesteremia   . Hypothyroidism   . Vitamin D deficiency    Past Surgical History:  Procedure Laterality Date  . UPPER GASTROINTESTINAL ENDOSCOPY  04/2014   [place] with Dr.Panyda/Patel   Social History   Socioeconomic History  . Marital status: Married    Spouse name: Not on file  . Number of children: Not on file  . Years of education: Not on file  . Highest education level: Not on file  Occupational History  . Not on file  Social Needs  . Financial resource strain: Not on file  . Food insecurity:    Worry: Not on file    Inability: Not on file  . Transportation needs:    Medical: Not on file    Non-medical: Not on file  Tobacco Use  . Smoking status: Never Smoker  . Smokeless tobacco: Never Used  Substance and Sexual Activity  . Alcohol use: Yes    Comment: socially  . Drug use: Never  . Sexual activity: Not on file  Lifestyle  . Physical activity:    Days per week: Not on file    Minutes per session: Not on file  . Stress: Not on file  Relationships  . Social connections:    Talks on phone: Not on file    Gets together: Not on file    Attends religious service: Not on file    Active member of club or organization: Not on file    Attends meetings of clubs or organizations: Not on file    Relationship status: Not on file  Other Topics Concern  . Not on file  Social History Narrative  . Not on file   Outpatient Encounter Medications as of 05/05/2019  Medication Sig  . fenofibrate (TRICOR) 145 MG tablet Take 145 mg by mouth daily.  Marland Kitchen levothyroxine (SYNTHROID) 125 MCG tablet Take 125 mcg by mouth daily before breakfast.   . Omega-3 Fatty Acids (FISH OIL)  1000 MG CPDR Take by mouth daily.  . [DISCONTINUED] Multiple Vitamin (MULTIVITAMIN) capsule Take 1 capsule by mouth daily.  . [DISCONTINUED] pantoprazole (PROTONIX) 40 MG tablet Take 1 tablet (40 mg total) by mouth daily before breakfast.  . [DISCONTINUED] Vitamin D, Ergocalciferol, (DRISDOL) 1.25 MG (50000 UT) CAPS capsule Take 50,000 Units by mouth every 7 (seven) days.  Marland Kitchen buPROPion (WELLBUTRIN XL) 300 MG 24 hr tablet Take 300 mg by mouth daily.  . Multiple Vitamin (MULTIVITAMIN WITH MINERALS) TABS tablet Take 1 tablet by mouth daily.  . [DISCONTINUED] Cholecalciferol (DIALYVITE VITAMIN D 5000) 125 MCG (5000 UT) capsule Take 5,000 Units by mouth daily.  . [DISCONTINUED] clobetasol cream (TEMOVATE) 1.82 % Apply 1 application topically daily as needed (skin irritation).  . [DISCONTINUED] dicyclomine (BENTYL) 10 MG capsule Take 1 capsule (10 mg total) by mouth 3 (three) times daily before meals.  . [DISCONTINUED] Lactobacillus (ACIDOPHILUS PO) Take 1 capsule by mouth daily.   No facility-administered encounter medications on file as of 05/05/2019.    ALLERGIES: Allergies  Allergen Reactions  . Sulfa Antibiotics Other (See Comments)    Causes a fever     VACCINATION STATUS:  There is no immunization history on file for this patient.  HPI:  William Lucero is a 33 y.o.-year-old man, being seen in consultation for evaluation and management of low testosterone  And hypothyroidism requested by by his  Andres Shad, MD.  He was found to have hypogonadism with total testostetone of 146 nanograms per DL on January 31, 2019 at 10 AM, low free testosterone of 7.7 on February 09, 2019. He admits for decreased libido. He denies  trauma to testes,  chemotherapy,  testicular irradiation,  nor genitourinary surgery. No h/o cryptorchidism. He denies history of  mumps orchitis/ history of  autoimmune disorders.  He grew and went through puberty like his peers. No personal history of  infertility -  has  1 biological child, and planning for more. No incomplete/delayed sexual development.     No breast discomfort/gynecomastia. No abnormal sense of smell (only allergies). No hot flushes. No vision problems.  No report of changing headaches. No FH of hypogonadism/infertility . No Family history of hemochromatosis or pituitary tumors. No recent rapid weight change. No chronic diseases. No chronic pain.   -He has used some unidentified products as a teenager for performance enhancement.  He denies excessive alcohol consumption.  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     -He was diagnosed with hypothyroidism a few years ago, initiated on levothyroxine which was progressively increased to current level of 125 mcg p.o. p.o. daily in the morning.  He has family history of thyroid dysfunction, denies family history of thyroid malignancy.  He reports compliance with medication.  He complains of dry skin.  He denies heat/cold intolerance.  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     ROS: Constitutional: no weight gain/loss, no fatigue, no subjective hyperthermia/hypothermia Eyes: no blurry vision, no xerophthalmia ENT: no sore throat, no nodules palpated in throat, no dysphagia/odynophagia, no hoarseness Cardiovascular: no CP/SOB/palpitations/leg swelling Respiratory: no cough/SOB Gastrointestinal: no N/V/D/C Musculoskeletal: no muscle/joint aches Skin: no rashes Neurological: no tremors/numbness/tingling/dizziness Psychiatric: no depression/anxiety  PE: BP 126/82   Pulse 72   Ht 5\' 11"  (1.803 m)   Wt 230 lb (104.3 kg)   BMI 32.08 kg/m  Wt Readings from Last 3 Encounters:  05/05/19 228 lb 12.8 oz (103.8 kg)  05/05/19 230 lb (104.3 kg)  01/18/19 225 lb 6.4 oz (102.2 kg)   Constitutional: overweight, in NAD Eyes: PERRLA, EOMI, no exophthalmos ENT: moist mucous membranes, no thyromegaly, no cervical  lymphadenopathy Cardiovascular: RRR, No MRG Respiratory: CTA B Gastrointestinal: abdomen soft, NT, ND, BS+ Musculoskeletal: no deformities, strength intact in all 4 Skin: moist, warm, no rashes Neurological: no tremor with outstretched hands, DTR normal in all 4 Genital exam: normal male escutcheon, no inguinal LAD, normal phallus, testes ~25 mL, no testicular masses, no penile discharge.  No gynecomastia.   CMP ( most recent) CMP     Component Value Date/Time   NA 136 12/28/2018 2044   K 4.1 12/28/2018 2044   CL 103 12/28/2018 2044   CO2 26 12/28/2018 2044   GLUCOSE 98 12/28/2018 2044   BUN 14 12/28/2018 2044   CREATININE 1.04 12/28/2018 2044   CALCIUM 9.3 12/28/2018 2044   PROT 7.5 12/28/2018 2044   ALBUMIN 4.8 12/28/2018 2044   AST 24 12/28/2018 2044   ALT 34 12/28/2018 2044   ALKPHOS 70 12/28/2018 2044   BILITOT 0.6 12/28/2018 2044   GFRNONAA >60 12/28/2018 2044   GFRAA >60 12/28/2018 2044   Free testosterone 7.7 (normal 8.7-25) on February 09, 2019,  PSA 0.9 normal, estradiol 12.4 normal. January 31, 2019 TSH 7.2, total T4 7.1,, total testosterone 146 at 10 AM.   ASSESSMENT: 1. Hypogonadism 2.  Hypothyroidism  PLAN:  I have examined the patient, reviewed his labs and have had a long discussion with the patient regarding his testosterone results.   - I have discussed potential causes of hypogonadism, diagnosis of hypogonadism,  and relevant workup to confirm the diagnosis of hypogonadism before initiating testosterone replacement therapy.   -  I also discussed adverse effects of unnecessary testosterone replacement short-term and long-term including negative effect on fertility. -It is beneficial to determine etiology of hypogonadism before initiation of treatment if possible. -I have approached him for more complete laboratory work including repeat of testosterone (total and free), SHBG; FSH/LH; prolactin, ferritin in a morning sample of serum.    -If this workup  indicates  Secondary etiology for hypogonadism, he will be considered for MRI imaging of sella/pituitary .  -His testicular exam is normal, has biological child.   -He is advised that inappropriate or unnecessary testosterone treatment will hamper his chance of fertility.  If his total testosterone returns greater than 250 ng per DL, he will not be offered testosterone supplement.   -If his testosterone total is less than 200, he may benefit from testosterone supplement.  -He will benefit the most from weight loss.  We briefly discussed about controlling his diet, improving his sleep, and exercise regularly.     2.  Hypothyroidism Etiology unclear.  He will be considered for repeat full profile thyroid function tests including antithyroid antibodies. He is advised to continue levothyroxine 125 mcg p.o. every morning.  - We discussed about the correct intake of his thyroid hormone, on empty stomach at fasting, with water, separated by at least 30 minutes from breakfast and other medications,  and separated by more than 4 hours from calcium, iron, multivitamins, acid reflux medications (PPIs). -Patient is made aware of the fact that thyroid hormone replacement is needed for life, dose to be adjusted by periodic monitoring of thyroid function tests.   -No prescription was initiated today.   - Time spent with the patient: 45 minutes, of which >50% was spent in obtaining information about his symptoms, reviewing his previous labs, evaluations, and treatments, counseling him about his hypogonadism, hypothyroidism, and developing a plan to confirm the diagnosis and long term treatment as necessary.  Evalyn Casco participated in the discussions, expressed understanding, and voiced agreement with the above plans.  All questions were answered to his satisfaction. he is encouraged to contact clinic should he have any questions or concerns prior to his return visit.  Return in about 10 days (around  05/15/2019), or fasting labs before 8AM, for Follow up with Pre-visit Labs.  Glade Lloyd, MD [facility] [address] [place], [place] [address] Phone: [phone]  Fax: [phone]   05/05/2019, 4:06 PM  This note was partially dictated with voice recognition software. Similar sounding words can be transcribed inadequately or may not  be corrected upon review.

## 2019-05-10 DIAGNOSIS — E291 Testicular hypofunction: Secondary | ICD-10-CM | POA: Diagnosis not present

## 2019-05-10 DIAGNOSIS — E039 Hypothyroidism, unspecified: Secondary | ICD-10-CM | POA: Diagnosis not present

## 2019-05-12 LAB — TESTOSTERONE, FREE, TOTAL, SHBG
Sex Hormone Binding: 17.6 nmol/L (ref 16.5–55.9)
Testosterone, Free: 13.6 pg/mL (ref 8.7–25.1)
Testosterone: 247 ng/dL — ABNORMAL LOW (ref 264–916)

## 2019-05-12 LAB — T4, FREE: Free T4: 1.63 ng/dL (ref 0.82–1.77)

## 2019-05-12 LAB — LUTEINIZING HORMONE: LH: 4.2 m[IU]/mL (ref 1.7–8.6)

## 2019-05-12 LAB — THYROGLOBULIN ANTIBODY: Thyroglobulin Antibody: 1.8 IU/mL — ABNORMAL HIGH (ref 0.0–0.9)

## 2019-05-12 LAB — THYROID PEROXIDASE ANTIBODY: Thyroperoxidase Ab SerPl-aCnc: 30 IU/mL (ref 0–34)

## 2019-05-12 LAB — FOLLICLE STIMULATING HORMONE: FSH: 1.8 m[IU]/mL (ref 1.5–12.4)

## 2019-05-12 LAB — PROLACTIN: Prolactin: 11.9 ng/mL (ref 4.0–15.2)

## 2019-05-12 LAB — TSH: TSH: 1.95 u[IU]/mL (ref 0.450–4.500)

## 2019-05-12 LAB — PSA: Prostate Specific Ag, Serum: 0.7 ng/mL (ref 0.0–4.0)

## 2019-05-14 MED FILL — FENOFIBRATE 120 MG TAB: 120 | 90 days supply | Qty: 90 | Fill #0

## 2019-05-17 ENCOUNTER — Ambulatory Visit: Payer: 59 | Admitting: "Endocrinology

## 2019-05-24 ENCOUNTER — Encounter: Payer: Self-pay | Admitting: "Endocrinology

## 2019-05-24 ENCOUNTER — Other Ambulatory Visit: Payer: Self-pay

## 2019-05-24 ENCOUNTER — Ambulatory Visit (INDEPENDENT_AMBULATORY_CARE_PROVIDER_SITE_OTHER): Payer: 59 | Admitting: "Endocrinology

## 2019-05-24 VITALS — BP 117/81 | Ht 71.0 in | Wt 227.0 lb

## 2019-05-24 DIAGNOSIS — E291 Testicular hypofunction: Secondary | ICD-10-CM | POA: Diagnosis not present

## 2019-05-24 DIAGNOSIS — E063 Autoimmune thyroiditis: Secondary | ICD-10-CM

## 2019-05-24 DIAGNOSIS — E038 Other specified hypothyroidism: Secondary | ICD-10-CM | POA: Diagnosis not present

## 2019-05-24 DIAGNOSIS — E782 Mixed hyperlipidemia: Secondary | ICD-10-CM | POA: Diagnosis not present

## 2019-05-24 NOTE — Progress Notes (Signed)
Endocrinology follow-up note   William Lucero, 33 y.o., male   Chief Complaint  Patient presents with  . Follow-up    Hypogonadism     Past Medical History:  Diagnosis Date  . Acid reflux   . Depression   . Hypercholesteremia   . Hypothyroidism   . Vitamin D deficiency    Past Surgical History:  Procedure Laterality Date  . UPPER GASTROINTESTINAL ENDOSCOPY  04/2014   [place] with Dr.Panyda/Patel   Social History   Socioeconomic History  . Marital status: Married    Spouse name: Not on file  . Number of children: Not on file  . Years of education: Not on file  . Highest education level: Not on file  Occupational History  . Not on file  Social Needs  . Financial resource strain: Not on file  . Food insecurity:    Worry: Not on file    Inability: Not on file  . Transportation needs:    Medical: Not on file    Non-medical: Not on file  Tobacco Use  . Smoking status: Never Smoker  . Smokeless tobacco: Never Used  Substance and Sexual Activity  . Alcohol use: Yes    Comment: socially  . Drug use: Never  . Sexual activity: Not on file  Lifestyle  . Physical activity:    Days per week: Not on file    Minutes per session: Not on file  . Stress: Not on file  Relationships  . Social connections:    Talks on phone: Not on file    Gets together: Not on file    Attends religious service: Not on file    Active member of club or organization: Not on file    Attends meetings of clubs or organizations: Not on file    Relationship status: Not on file  Other Topics Concern  . Not on file  Social History Narrative  . Not on file   Outpatient Encounter Medications as of 05/24/2019  Medication Sig  . buPROPion (WELLBUTRIN XL) 300 MG 24 hr tablet Take 300 mg by mouth daily.  . Cholecalciferol (VITAMIN D3 PO) Take 5,000 Units by mouth daily.  . fenofibrate (TRICOR) 145 MG tablet Take 145 mg by mouth daily.  Marland Kitchen levocetirizine  (XYZAL) 5 MG tablet Take 5 mg by mouth every evening.  Marland Kitchen levothyroxine (SYNTHROID) 125 MCG tablet Take 125 mcg by mouth daily before breakfast.   . Multiple Vitamin (MULTIVITAMIN WITH MINERALS) TABS tablet Take 1 tablet by mouth daily.  . niacin 500 MG tablet Take 500 mg by mouth 2 (two) times a day.  . Omega-3 Fatty Acids (FISH OIL) 1000 MG CPDR Take by mouth daily.  . pantoprazole (PROTONIX) 40 MG tablet Take 1 tablet (40 mg total) by mouth daily before breakfast.   No facility-administered encounter medications on file as of 05/24/2019.    ALLERGIES: Allergies  Allergen Reactions  . Sulfa Antibiotics Other (See Comments)    Causes a fever     VACCINATION STATUS:  There is no immunization history on file for this patient.  HPI: William Lucero is a 33 y.o.-year-old man, being seen in consultation for evaluation and management of low testosterone  And hypothyroidism requested by by his  Andres Shad, MD.  He was found to have hypogonadism with total testostetone of 146 nanograms per DL on January 31, 2019 at 10 AM, low free testosterone of 7.7 on February 09, 2019.  His previsit labs show a.m. testosterone still low  at 11. -He admits to this suboptimal libido, and weak erections.  He denies  trauma to testes,  chemotherapy,  testicular irradiation,  nor genitourinary surgery. No h/o cryptorchidism. He denies history of  mumps orchitis/ history of  autoimmune disorders.  He grew and went through puberty like his peers. No personal history of  infertility - has  1 biological child, and planning for more. No incomplete/delayed sexual development.  No hot flushes. No vision problems.  No report of changing headaches. No FH of hypogonadism/infertility . No Family history of hemochromatosis or pituitary tumors. No recent rapid weight change. No chronic diseases. No chronic pain.   -He has used some unidentified products as a teenager for performance enhancement.  He denies  excessive alcohol consumption.  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     -He was diagnosed with hypothyroidism a few years ago, initiated on levothyroxine which was progressively increased to current level of 125 mcg p.o. p.o. daily in the morning.  He has family history of thyroid dysfunction, denies family history of thyroid malignancy.  He reports compliance with medication.  He complains of dry skin.  He denies heat/cold intolerance.  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     ROS: Constitutional: no weight gain/loss, no fatigue, no subjective hyperthermia/hypothermia Eyes: no blurry vision, no xerophthalmia ENT: no sore throat, no nodules palpated in throat, no dysphagia/odynophagia, no hoarseness Musculoskeletal: no muscle/joint aches Skin: no rashes Neurological: no tremors/numbness/tingling/dizziness Psychiatric: no depression/anxiety  PE: BP 117/81   Ht 5\' 11"  (1.803 m)   Wt 227 lb (103 kg)   BMI 31.66 kg/m  Wt Readings from Last 3 Encounters:  05/24/19 227 lb (103 kg)  05/05/19 228 lb 12.8 oz (103.8 kg)  05/05/19 230 lb (104.3 kg)   Constitutional: overweight, in NAD Eyes: PERRLA, EOMI, no exophthalmos ENT: moist mucous membranes, no thyromegaly, no cervical lymphadenopathy Cardiovascular: RRR, No MRG Genital exam: normal male escutcheon, no inguinal LAD, normal phallus, testes ~25 mL, no testicular masses, no penile discharge.  No gynecomastia.   CMP     Component Value Date/Time   NA 136 12/28/2018 2044   K 4.1 12/28/2018 2044   CL 103 12/28/2018 2044   CO2 26 12/28/2018 2044   GLUCOSE 98 12/28/2018 2044   BUN 14 12/28/2018 2044   CREATININE 1.04 12/28/2018 2044   CALCIUM 9.3 12/28/2018 2044   PROT 7.5 12/28/2018 2044   ALBUMIN 4.8 12/28/2018 2044   AST 24 12/28/2018 2044   ALT 34 12/28/2018 2044   ALKPHOS 70 12/28/2018 2044   BILITOT 0.6 12/28/2018 2044   GFRNONAA >60 12/28/2018 2044   GFRAA  >60 12/28/2018 2044   Free testosterone 7.7 (normal 8.7-25) on February 09, 2019, PSA 0.9 normal, estradiol 12.4 normal. January 31, 2019 TSH 7.2, total T4 7.1,, total testosterone 146 at 10 AM. Recent Results (from the past 2160 hour(s))  Luteinizing hormone     Status: None   Collection Time: 05/10/19  8:32 AM  Result Value Ref Range   LH 4.2 1.7 - 8.6 mIU/mL  Follicle stimulating hormone     Status: None   Collection Time: 05/10/19  8:32 AM  Result Value Ref Range   FSH 1.8 1.5 - 12.4 mIU/mL  Prolactin     Status: None   Collection Time: 05/10/19  8:32 AM  Result Value Ref Range   Prolactin 11.9 4.0 - 15.2 ng/mL  PSA  Status: None   Collection Time: 05/10/19  8:32 AM  Result Value Ref Range   Prostate Specific Ag, Serum 0.7 0.0 - 4.0 ng/mL    Comment: Roche ECLIA methodology. According to the American Urological Association, Serum PSA should decrease and remain at undetectable levels after radical prostatectomy. The AUA defines biochemical recurrence as an initial PSA value 0.2 ng/mL or greater followed by a subsequent confirmatory PSA value 0.2 ng/mL or greater. Values obtained with different assay methods or kits cannot be used interchangeably. Results cannot be interpreted as absolute evidence of the presence or absence of malignant disease.   Testosterone, Free, Total, SHBG     Status: Abnormal   Collection Time: 05/10/19  8:32 AM  Result Value Ref Range   Testosterone 247 (L) 264 - 916 ng/dL    Comment: Adult male reference interval is based on a population of healthy nonobese males (BMI <30) between 50 and 57 years old. [place] [address], [place] [phone]. PMID: 55732202.    Testosterone, Free 13.6 8.7 - 25.1 pg/mL   Sex Hormone Binding 17.6 16.5 - 55.9 nmol/L  TSH     Status: None   Collection Time: 05/10/19  8:32 AM  Result Value Ref Range   TSH 1.950 0.450 - 4.500 uIU/mL  T4, free     Status: None   Collection Time: 05/10/19  8:32 AM  Result Value  Ref Range   Free T4 1.63 0.82 - 1.77 ng/dL  Thyroid peroxidase antibody     Status: None   Collection Time: 05/10/19  8:32 AM  Result Value Ref Range   Thyroperoxidase Ab SerPl-aCnc 30 0 - 34 IU/mL  Thyroglobulin antibody     Status: Abnormal   Collection Time: 05/10/19  8:32 AM  Result Value Ref Range   Thyroglobulin Antibody 1.8 (H) 0.0 - 0.9 IU/mL    Comment: Thyroglobulin Antibody measured by [employer] Methodology     ASSESSMENT: 1. Hypogonadism 2.  Hypothyroidism  PLAN:  -I discussed his lab results with him, confirming hypogonadism etiology unclear.  His gonadotropins are inappropriately normal, prolactin normal.  He would not need pituitary imaging at this time.   -He is a candidate for testosterone supplement, and there are no contraindications.  However, he and his wife are planning for another child in the near future.  I discussed with him the adverse effect of testosterone supplement on fertility and he wishes to stay off of supplement for now.  -His testicular exam is normal, has biological child.   -He will have repeat testosterone before his next visit in 6 months.   -He will benefit the most from weight loss.  We briefly discussed about controlling his diet, improving his sleep, and exercise regularly.     2.  Hypothyroidism -His hypothyroidism is due to Hashimoto's thyroiditis.  He is currently on levothyroxine 125 mcg p.o. every morning.  This is the right dose for now.  - We discussed about the correct intake of his thyroid hormone, on empty stomach at fasting, with water, separated by at least 30 minutes from breakfast and other medications,  and separated by more than 4 hours from calcium, iron, multivitamins, acid reflux medications (PPIs). -Patient is made aware of the fact that thyroid hormone replacement is needed for life, dose to be adjusted by periodic monitoring of thyroid function tests.  -No prescription was initiated today.  Time for this visit  15 minutes.  Evalyn Casco participated in the discussions, expressed understanding, and voiced agreement with the above  plans.  All questions were answered to his satisfaction. he is encouraged to contact clinic should he have any questions or concerns prior to his return visit.   Return in about 6 months (around 11/23/2019), or Fasting labs before 8AM, for Follow up with Pre-visit Labs.  Glade Lloyd, MD [facility] [address] [place], [place] [address] Phone: [phone]  Fax: [phone]   05/24/2019, 4:57 PM  This note was partially dictated with voice recognition software. Similar sounding words can be transcribed inadequately or may not  be corrected upon review.

## 2019-05-30 MED FILL — buPROPion HCL ER (XL) 300 M: 300 | 30 days supply | Qty: 30 | Fill #0

## 2019-06-21 MED FILL — PANTOPRAZOLE SOD DR 40 MG T: 40 | 30 days supply | Qty: 30 | Fill #1

## 2019-07-08 MED FILL — buPROPion HCL ER (XL) 300 M: 300 | 30 days supply | Qty: 30 | Fill #1

## 2019-07-13 DIAGNOSIS — L659 Nonscarring hair loss, unspecified: Secondary | ICD-10-CM | POA: Diagnosis not present

## 2019-07-13 DIAGNOSIS — R5383 Other fatigue: Secondary | ICD-10-CM | POA: Diagnosis not present

## 2019-07-13 DIAGNOSIS — E039 Hypothyroidism, unspecified: Secondary | ICD-10-CM | POA: Diagnosis not present

## 2019-07-13 DIAGNOSIS — E781 Pure hyperglyceridemia: Secondary | ICD-10-CM | POA: Diagnosis not present

## 2019-07-14 DIAGNOSIS — Z713 Dietary counseling and surveillance: Secondary | ICD-10-CM | POA: Diagnosis not present

## 2019-07-14 DIAGNOSIS — Z6832 Body mass index (BMI) 32.0-32.9, adult: Secondary | ICD-10-CM | POA: Diagnosis not present

## 2019-07-14 DIAGNOSIS — E039 Hypothyroidism, unspecified: Secondary | ICD-10-CM | POA: Diagnosis not present

## 2019-07-14 DIAGNOSIS — F341 Dysthymic disorder: Secondary | ICD-10-CM | POA: Diagnosis not present

## 2019-07-14 DIAGNOSIS — E781 Pure hyperglyceridemia: Secondary | ICD-10-CM | POA: Diagnosis not present

## 2019-07-14 DIAGNOSIS — K219 Gastro-esophageal reflux disease without esophagitis: Secondary | ICD-10-CM | POA: Diagnosis not present

## 2019-07-14 MED FILL — LEVOTHYROXINE 137 MCG TAB: 137 | 30 days supply | Qty: 30 | Fill #0

## 2019-07-19 DIAGNOSIS — L723 Sebaceous cyst: Secondary | ICD-10-CM | POA: Diagnosis not present

## 2019-07-19 DIAGNOSIS — Z719 Counseling, unspecified: Secondary | ICD-10-CM | POA: Diagnosis not present

## 2019-07-19 DIAGNOSIS — D234 Other benign neoplasm of skin of scalp and neck: Secondary | ICD-10-CM | POA: Diagnosis not present

## 2019-07-19 DIAGNOSIS — D23111 Other benign neoplasm of skin of right upper eyelid, including canthus: Secondary | ICD-10-CM | POA: Diagnosis not present

## 2019-07-19 DIAGNOSIS — D2322 Other benign neoplasm of skin of left ear and external auricular canal: Secondary | ICD-10-CM | POA: Diagnosis not present

## 2019-07-19 DIAGNOSIS — Z1283 Encounter for screening for malignant neoplasm of skin: Secondary | ICD-10-CM | POA: Diagnosis not present

## 2019-07-19 DIAGNOSIS — D230 Other benign neoplasm of skin of lip: Secondary | ICD-10-CM | POA: Diagnosis not present

## 2019-07-19 DIAGNOSIS — D23121 Other benign neoplasm of skin of left upper eyelid, including canthus: Secondary | ICD-10-CM | POA: Diagnosis not present

## 2019-07-19 DIAGNOSIS — D2339 Other benign neoplasm of skin of other parts of face: Secondary | ICD-10-CM | POA: Diagnosis not present

## 2019-07-19 DIAGNOSIS — D2321 Other benign neoplasm of skin of right ear and external auricular canal: Secondary | ICD-10-CM | POA: Diagnosis not present

## 2019-07-21 DIAGNOSIS — D2339 Other benign neoplasm of skin of other parts of face: Secondary | ICD-10-CM | POA: Diagnosis not present

## 2019-07-25 DIAGNOSIS — D2339 Other benign neoplasm of skin of other parts of face: Secondary | ICD-10-CM | POA: Diagnosis not present

## 2019-07-28 MED FILL — PANTOPRAZOLE SOD DR 40 MG T: 40 | 30 days supply | Qty: 30 | Fill #0

## 2019-08-09 MED FILL — buPROPion HCL ER (XL) 300 M: 300 | 90 days supply | Qty: 90 | Fill #0

## 2019-08-09 MED FILL — LEVOTHYROXINE 137 MCG TAB: 137 | 30 days supply | Qty: 30 | Fill #1

## 2019-08-09 MED FILL — FENOFIBRATE 120 MG TAB: 120 | 90 days supply | Qty: 90 | Fill #0

## 2019-08-25 DIAGNOSIS — E039 Hypothyroidism, unspecified: Secondary | ICD-10-CM | POA: Diagnosis not present

## 2019-08-25 MED FILL — PANTOPRAZOLE SOD DR 40 MG T: 40 | 30 days supply | Qty: 30 | Fill #1

## 2019-09-07 ENCOUNTER — Other Ambulatory Visit: Payer: Self-pay

## 2019-09-07 ENCOUNTER — Encounter: Payer: Self-pay | Admitting: Orthopedic Surgery

## 2019-09-07 ENCOUNTER — Ambulatory Visit: Payer: 59

## 2019-09-07 ENCOUNTER — Ambulatory Visit: Payer: 59 | Admitting: Orthopedic Surgery

## 2019-09-07 VITALS — BP 130/97 | HR 91 | Temp 97.3°F | Ht 71.0 in | Wt 220.0 lb

## 2019-09-07 DIAGNOSIS — M25511 Pain in right shoulder: Secondary | ICD-10-CM

## 2019-09-07 DIAGNOSIS — M7521 Bicipital tendinitis, right shoulder: Secondary | ICD-10-CM | POA: Diagnosis not present

## 2019-09-07 MED ORDER — MELOXICAM 7.5 MG PO TABS: 7.5000 mg | ORAL_TABLET | Freq: Every day | ORAL | 5 refills | Status: DC

## 2019-09-07 NOTE — Patient Instructions (Addendum)
You have received an injection of steroids into the joint. 15% of patients will have increased pain within the 24 hours postinjection.   This is transient and will go away.   We recommend that you use ice packs on the injection site for 20 minutes every 2 hours and extra strength Tylenol 2 tablets every 8 as needed until the pain resolves.  If you continue to have pain after taking the Tylenol and using the ice please call the office for further instructions. Proximal Biceps Tendinitis and Tenosynovitis  The proximal biceps tendon is a strong cord of tissue that connects the biceps muscle on the front of the upper arm to the shoulder blade. Tendinitis is inflammation of a tendon. Tenosynovitis is inflammation of the lining around the tendon (tendon sheath). These conditions often occur at the same time, and they can interfere with the ability to bend the elbow and turn the palm of the hand up. Proximal biceps tendinitis and tenosynovitis are usually caused by overusing the shoulder joint and the biceps muscle. These conditions usually heal within 6 weeks. Proximal biceps tendinitis may include a grade 1 or grade 2 strain of the tendon.  A grade 1 strain is mild, and it involves a slight pull of the tendon without any stretching or noticeable tearing of the tendon. There is usually no loss of biceps muscle strength.  A grade 2 strain is moderate, and it involves a small tear in the tendon. The tendon is stretched, and biceps strength is usually decreased. What are the causes? This condition may be caused by:  A sudden increase in frequency or intensity of activity that involves the shoulder and the biceps muscle.  Overuse of the biceps muscle. This can happen when you do the same movements over and over, such as: ? Turning the palm of the hand up. ? Forceful straightening (hyperextension) of the elbow. ? Bending the elbow.  A direct, forceful hit or injury to the elbow. This is  rare. What increases the risk? The following factors may make you more likely to develop this condition:  Playing contact sports.  Playing sports that involve throwing and overhead movements, including racket sports, gymnastics, weight lifting, or bodybuilding.  Doing physical labor.  Having poor strength and flexibility of the arm and shoulder. What are the signs or symptoms? Symptoms of this condition may include:  Pain and inflammation in the front of the shoulder.  A feeling of warmth in the front of the shoulder.  Limited range of motion of the shoulder and the elbow.  A crackling sound (crepitation) when you move or touch the shoulder or the upper arm. In some cases, symptoms may return after treatment, and they may be long-lasting (chronic). How is this diagnosed? This condition is diagnosed based on:  Your symptoms.  Your medical history.  Physical exam.  X-ray or MRI, if needed. How is this treated? Treatment for this condition depends on the severity of your injury. It may include:  Resting the injured arm.  Icing the injured area.  Doing physical therapy. Your health care provider may also use:  Medicines to treat pain and inflammation.  Sound waves to treat the injured muscle (ultrasound therapy).  Medicines that are injected to the muscle (corticosteroids).  Medicines that numb the area (local anesthetics).  Surgery. This is done if other treatments have not worked. Follow these instructions at home: Managing pain, stiffness, and swelling      If directed, put ice on the  injured area. ? Put ice in a plastic bag. ? Place a towel between your skin and the bag. ? Leave the ice on for 20 minutes, 2-3 times a day.  If directed, apply heat to the affected area before you exercise. Use the heat source that your health care provider recommends, such as a moist heat pack or a heating pad. ? Place a towel between your skin and the heat  source. ? Leave the heat on for 20-30 minutes. ? Remove the heat if your skin turns bright red. This is especially important if you are unable to feel pain, heat, or cold. You may have a greater risk of getting burned.  Move your fingers often to reduce stiffness and swelling.  Raise (elevate) the injured area above the level of your heart while you are lying down. Activity  Do not lift anything that is heavier than 10 lb (4.5 kg), or the limit that you are told, until your health care provider says that it is safe.  Avoid activities that cause pain or make your condition worse.  Return to your normal activities as told by your health care provider. Ask your health care provider what activities are safe for you.  Do exercises as told by your health care provider. General instructions  Take over-the-counter and prescription medicines only as told by your health care provider.  Do not use any products that contain nicotine or tobacco, such as cigarettes, e-cigarettes, and chewing tobacco. These can delay healing. If you need help quitting, ask your health care provider.  Keep all follow-up visits as told by your health care provider. This is important. How is this prevented?  Warm up and stretch before being active.  Cool down and stretch after being active.  Give your body time to rest between periods of activity.  Make sure any equipment that you use is fitted to you.  Be safe and responsible while being active to avoid falls.  Maintain physical fitness, including: ? Strength. ? Flexibility. ? Heart health (cardiovascular fitness). ? The ability to use muscles for a long time (endurance). Contact a health care provider if:  You have symptoms that get worse or do not get better after 2 weeks of treatment.  You develop new symptoms. Get help right away if:  You develop severe pain. Summary  Tendinitis is inflammation of the biceps tendon. Tenosynovitis is inflammation  of the lining around the biceps tendon. These conditions often occur at the same time.  These conditions are usually caused by overusing the shoulder joint and biceps muscle.  Symptoms include pain, warmth in the shoulder, and limited range of motion.  The two conditions are treated with rest, ice, medicines, and surgery (rare). This information is not intended to replace advice given to you by your health care provider. Make sure you discuss any questions you have with your health care provider. Document Released: 12/01/2005 Document Revised: 03/29/2019 Document Reviewed: 01/27/2019 Elsevier Patient Education  2020 [employer].

## 2019-09-07 NOTE — Progress Notes (Signed)
William Lucero  09/07/2019  HISTORY SECTION :  Chief Complaint  Patient presents with  . Shoulder Pain    Right shoulder pain.   HPI The patient presents for evaluation of painful right shoulder since beginning of the summer.  He has pain in the anterior part of the shoulder described as a dull ache unrelieved with ibuprofen and rest associated with some neck discomfort and weakness especially when opening and turning things such as a jar  Review of Systems  Gastrointestinal: Positive for heartburn.  Psychiatric/Behavioral: Positive for depression.     has a past medical history of Acid reflux, Depression, Hypercholesteremia, Hypothyroidism, and Vitamin D deficiency.   Past Surgical History:  Procedure Laterality Date  . UPPER GASTROINTESTINAL ENDOSCOPY  04/2014   [place] with Dr.Panyda/Patel    Body mass index is 30.68 kg/m.   Allergies  Allergen Reactions  . Sulfa Antibiotics Other (See Comments)    Causes a fever      Current Outpatient Medications:  .  buPROPion (WELLBUTRIN XL) 300 MG 24 hr tablet, Take 300 mg by mouth daily., Disp: , Rfl:  .  Cholecalciferol (VITAMIN D3 PO), Take 5,000 Units by mouth daily., Disp: , Rfl:  .  fenofibrate (TRICOR) 145 MG tablet, Take 145 mg by mouth daily., Disp: , Rfl:  .  levocetirizine (XYZAL) 5 MG tablet, Take 5 mg by mouth every evening., Disp: , Rfl:  .  levothyroxine (SYNTHROID) 125 MCG tablet, Take 125 mcg by mouth daily before breakfast. , Disp: , Rfl:  .  Multiple Vitamin (MULTIVITAMIN WITH MINERALS) TABS tablet, Take 1 tablet by mouth daily., Disp: , Rfl:  .  niacin 500 MG tablet, Take 500 mg by mouth 2 (two) times a day., Disp: , Rfl:  .  Omega-3 Fatty Acids (FISH OIL) 1000 MG CPDR, Take by mouth daily., Disp: , Rfl:  .  pantoprazole (PROTONIX) 40 MG tablet, Take 1 tablet (40 mg total) by mouth daily before breakfast., Disp: 30 tablet, Rfl: 5   PHYSICAL EXAM SECTION: 1) BP (!) 130/97   Pulse 91   Temp (!) 97.3 F  (36.3 C)   Ht 5\' 11"  (1.803 m)   Wt 220 lb (99.8 kg)   BMI 30.68 kg/m   Body mass index is 30.68 kg/m. General appearance: Well-developed well-nourished no gross deformities  2) Cardiovascular normal pulse and perfusion in the upper extremities normal color without edema  3) Neurologically deep tendon reflexes are equal and normal, no sensation loss or deficits no pathologic reflexes  4) Psychological: Awake alert and oriented x3 mood and affect normal  5) Skin no lacerations or ulcerations no nodularity no palpable masses, no erythema or nodularity  6) Musculoskeletal:   Left shoulder full range of motion shoulder feels stable muscle tone is normal  Right shoulder is tender over the biceps tendon no weakness in the rotator cuff positive Speed and Yergason with palpable tenderness in the rotator interval and biceps full range of motion negative impingement sign no instability   MEDICAL DECISION SECTION:  Encounter Diagnosis  Name Primary?  . Right shoulder pain, unspecified chronicity Yes    Imaging X-ray normal type I acromion see report Plan:  (Rx., Inj., surg., Frx, MRI/CT, XR:2) Meds ordered this encounter  Medications  . meloxicam (MOBIC) 7.5 MG tablet    Sig: Take 1 tablet (7.5 mg total) by mouth daily.    Dispense:  30 tablet    Refill:  5    Procedure: Biceps tendon injection Right biceps  tendon was injected The patient gave verbal consent for cortisone injection Timeout confirmed the site of injection Medications used included 40 mg of Depo-Medrol and 3 mL 1% lidocaine After alcohol and ethyl chloride preparation the point of maximal tenderness was injected over the right biceps tendon there were no complications  Follow-up 6 weeks 9:07 AM Arther Abbott, MD  09/07/2019

## 2019-09-15 MED FILL — LEVOTHYROXINE 137 MCG TAB: 137 | 30 days supply | Qty: 30 | Fill #0

## 2019-09-22 MED FILL — PANTOPRAZOLE SOD DR 40 MG T: 40 | 30 days supply | Qty: 30 | Fill #2

## 2019-10-12 MED FILL — LEVOTHYROXINE 137 MCG TAB: 137 | 30 days supply | Qty: 30 | Fill #1

## 2019-10-19 ENCOUNTER — Other Ambulatory Visit: Payer: Self-pay

## 2019-10-19 ENCOUNTER — Ambulatory Visit (INDEPENDENT_AMBULATORY_CARE_PROVIDER_SITE_OTHER): Payer: 59 | Admitting: Orthopedic Surgery

## 2019-10-19 VITALS — BP 129/94 | HR 82 | Temp 97.7°F | Ht 71.0 in | Wt 220.0 lb

## 2019-10-19 DIAGNOSIS — S43431A Superior glenoid labrum lesion of right shoulder, initial encounter: Secondary | ICD-10-CM | POA: Diagnosis not present

## 2019-10-19 DIAGNOSIS — M7521 Bicipital tendinitis, right shoulder: Secondary | ICD-10-CM | POA: Diagnosis not present

## 2019-10-19 DIAGNOSIS — G8929 Other chronic pain: Secondary | ICD-10-CM

## 2019-10-19 DIAGNOSIS — M25511 Pain in right shoulder: Secondary | ICD-10-CM

## 2019-10-19 NOTE — Progress Notes (Signed)
Chief Complaint  Patient presents with  . Follow-up    Recheck on right shoulder.   S/p injection and NSAID for rt sh biceps tendonitis (mobic)-mobic daily instead of prn.  William Lucero states he has had some increase in pain in the right shoulder and although the injection gave him a couple of days of relief pain came right back.  He is taking his Mobic on an as-needed basis and he says he has tried to decrease his activity at work  History of presentation Chief Complaint  Patient presents with  . Shoulder Pain      Right shoulder pain.   HPI The patient presents for evaluation of painful right shoulder since beginning of the summer.  He has pain in the anterior part of the shoulder described as a dull ache unrelieved with ibuprofen and rest associated with some neck discomfort and weakness especially when opening and turning things such as a jar   No change in review of systems  Review of Systems  Gastrointestinal: Positive for heartburn.  Psychiatric/Behavioral: Positive for depression.      has a past medical history of Acid reflux, Depression, Hypercholesteremia, Hypothyroidism, and Vitamin D deficiency.   BP (!) 129/94   Pulse 82   Temp 97.7 F (36.5 C)   Ht 5\' 11"  (1.803 m)   Wt 220 lb (99.8 kg)   BMI 30.68 kg/m   Right shoulder is tender over the anterior joint and biceps tendon with normal active and passive range of motion normal stability good strength in the rotator cuff skin is intact neurovascular exam is normal  Speed and Yergason's tests show positive findings for biceps tendinitis  Encounter Diagnoses  Name Primary?  . Chronic right shoulder pain Yes  . Tendonitis of long head of biceps brachii of right shoulder   . Superior glenoid labrum lesion of right shoulder, initial encounter     Plan recommend increase Mobic to daily use  MRI right shoulder rule out rotator cuff tear biceps labral pathology  Call patient with results

## 2019-10-19 NOTE — Patient Instructions (Signed)
You have been scheduled for an MRI scan We will call your insurance company to do a precertification to get the MRI covered You will receive a phone call regarding the date of the scan I will call you with the results

## 2019-10-25 ENCOUNTER — Ambulatory Visit (HOSPITAL_COMMUNITY): Payer: 59

## 2019-10-28 MED FILL — PANTOPRAZOLE SOD DR 40 MG T: 40 | 30 days supply | Qty: 30 | Fill #3

## 2019-10-31 ENCOUNTER — Ambulatory Visit (INDEPENDENT_AMBULATORY_CARE_PROVIDER_SITE_OTHER): Payer: 59 | Admitting: Internal Medicine

## 2019-11-03 DIAGNOSIS — L2084 Intrinsic (allergic) eczema: Secondary | ICD-10-CM | POA: Diagnosis not present

## 2019-11-03 MED FILL — AZITHROMYCIN 250 MG TABLET: 250 | 5 days supply | Qty: 6 | Fill #0

## 2019-11-03 MED FILL — FLUOCINONIDE 0.05% OINTMENT: 0.05 | 30 days supply | Qty: 60 | Fill #0

## 2019-11-07 ENCOUNTER — Ambulatory Visit (INDEPENDENT_AMBULATORY_CARE_PROVIDER_SITE_OTHER): Payer: 59 | Admitting: Internal Medicine

## 2019-11-08 MED FILL — BUPROPION HCL XL 300 MG TAB: 300 | 90 days supply | Qty: 90 | Fill #1

## 2019-11-08 MED FILL — LEVOTHYROXINE 137 MCG TAB: 137 | 30 days supply | Qty: 30 | Fill #0

## 2019-11-08 MED FILL — FENOFIBRATE 120 MG TAB: 120 | 90 days supply | Qty: 90 | Fill #1

## 2019-11-24 ENCOUNTER — Ambulatory Visit: Payer: 59 | Admitting: "Endocrinology

## 2019-12-01 MED FILL — PANTOPRAZOLE SOD DR 40 MG T: 40 | 30 days supply | Qty: 30 | Fill #4

## 2019-12-22 ENCOUNTER — Ambulatory Visit: Payer: 59 | Admitting: "Endocrinology

## 2019-12-22 MED FILL — LEVOTHYROXINE 137 MCG TAB: 137 | 30 days supply | Qty: 30 | Fill #1

## 2019-12-22 MED FILL — FLUOCINONIDE 0.05% OINTMENT: 0.05 | 30 days supply | Qty: 60 | Fill #1

## 2020-01-04 DIAGNOSIS — E781 Pure hyperglyceridemia: Secondary | ICD-10-CM | POA: Diagnosis not present

## 2020-01-04 DIAGNOSIS — Z136 Encounter for screening for cardiovascular disorders: Secondary | ICD-10-CM | POA: Diagnosis not present

## 2020-01-04 DIAGNOSIS — E039 Hypothyroidism, unspecified: Secondary | ICD-10-CM | POA: Diagnosis not present

## 2020-01-06 MED FILL — PANTOPRAZOLE SOD DR 40 MG T: 40 | 30 days supply | Qty: 30 | Fill #5

## 2020-01-09 DIAGNOSIS — Z6832 Body mass index (BMI) 32.0-32.9, adult: Secondary | ICD-10-CM | POA: Diagnosis not present

## 2020-01-09 DIAGNOSIS — F341 Dysthymic disorder: Secondary | ICD-10-CM | POA: Diagnosis not present

## 2020-01-09 DIAGNOSIS — E039 Hypothyroidism, unspecified: Secondary | ICD-10-CM | POA: Diagnosis not present

## 2020-01-09 DIAGNOSIS — F419 Anxiety disorder, unspecified: Secondary | ICD-10-CM | POA: Diagnosis not present

## 2020-01-09 DIAGNOSIS — Z713 Dietary counseling and surveillance: Secondary | ICD-10-CM | POA: Diagnosis not present

## 2020-01-09 DIAGNOSIS — E781 Pure hyperglyceridemia: Secondary | ICD-10-CM | POA: Diagnosis not present

## 2020-01-09 MED FILL — busPIRone HCL 15 MG TABS: 15 | 10 days supply | Qty: 30 | Fill #0

## 2020-01-16 MED FILL — LEVOTHYROXINE 137 MCG TAB: 137 | 30 days supply | Qty: 30 | Fill #0

## 2020-01-19 MED FILL — BUPROPION HCL ER (XL) 300 M: 300 | 90 days supply | Qty: 90 | Fill #0

## 2020-02-10 ENCOUNTER — Other Ambulatory Visit (INDEPENDENT_AMBULATORY_CARE_PROVIDER_SITE_OTHER): Payer: Self-pay | Admitting: Internal Medicine

## 2020-02-10 MED FILL — PANTOPRAZOLE SOD DR 40 MG T: 40 | 30 days supply | Qty: 30 | Fill #0

## 2020-02-10 MED FILL — FENOFIBRATE 120 MG TABLET: 120 | 90 days supply | Qty: 90 | Fill #0

## 2020-02-10 NOTE — Telephone Encounter (Signed)
Patient will need appointment for further refills , he was last seen in the office 04/2019. Patient may also get from his PCP.

## 2020-02-14 MED FILL — FENOFIBRATE 134 MG CAPSULE: 134 | 90 days supply | Qty: 90 | Fill #0

## 2020-02-20 MED FILL — LEVOTHYROXINE 137 MCG TAB: 137 | 30 days supply | Qty: 30 | Fill #1

## 2020-02-20 MED FILL — busPIRone HCL 15 MG TABS: 15 | 10 days supply | Qty: 30 | Fill #1

## 2020-03-13 MED FILL — PANTOPRAZOLE SOD DR 40 MG T: 40 | 30 days supply | Qty: 30 | Fill #1

## 2020-03-15 DIAGNOSIS — Z713 Dietary counseling and surveillance: Secondary | ICD-10-CM | POA: Diagnosis not present

## 2020-03-15 DIAGNOSIS — R947 Abnormal results of other endocrine function studies: Secondary | ICD-10-CM | POA: Diagnosis not present

## 2020-03-15 DIAGNOSIS — F341 Dysthymic disorder: Secondary | ICD-10-CM | POA: Diagnosis not present

## 2020-03-15 DIAGNOSIS — Z6832 Body mass index (BMI) 32.0-32.9, adult: Secondary | ICD-10-CM | POA: Diagnosis not present

## 2020-03-15 DIAGNOSIS — E039 Hypothyroidism, unspecified: Secondary | ICD-10-CM | POA: Diagnosis not present

## 2020-03-15 DIAGNOSIS — R5383 Other fatigue: Secondary | ICD-10-CM | POA: Diagnosis not present

## 2020-03-15 DIAGNOSIS — Z1389 Encounter for screening for other disorder: Secondary | ICD-10-CM | POA: Diagnosis not present

## 2020-03-22 DIAGNOSIS — R5383 Other fatigue: Secondary | ICD-10-CM | POA: Diagnosis not present

## 2020-03-22 DIAGNOSIS — R7989 Other specified abnormal findings of blood chemistry: Secondary | ICD-10-CM | POA: Diagnosis not present

## 2020-03-22 DIAGNOSIS — R947 Abnormal results of other endocrine function studies: Secondary | ICD-10-CM | POA: Diagnosis not present

## 2020-03-29 MED FILL — LEVOTHYROXINE 137 MCG TAB: 137 | 30 days supply | Qty: 30 | Fill #2

## 2020-04-11 ENCOUNTER — Other Ambulatory Visit: Payer: Self-pay

## 2020-04-12 ENCOUNTER — Ambulatory Visit: Payer: 59 | Admitting: Internal Medicine

## 2020-04-12 ENCOUNTER — Encounter: Payer: Self-pay | Admitting: Internal Medicine

## 2020-04-12 VITALS — BP 118/84 | HR 92 | Temp 98.1°F | Ht 71.0 in | Wt 222.6 lb

## 2020-04-12 DIAGNOSIS — Z6831 Body mass index (BMI) 31.0-31.9, adult: Secondary | ICD-10-CM | POA: Diagnosis not present

## 2020-04-12 DIAGNOSIS — E291 Testicular hypofunction: Secondary | ICD-10-CM

## 2020-04-12 DIAGNOSIS — R5383 Other fatigue: Secondary | ICD-10-CM | POA: Diagnosis not present

## 2020-04-12 DIAGNOSIS — E063 Autoimmune thyroiditis: Secondary | ICD-10-CM | POA: Diagnosis not present

## 2020-04-12 MED FILL — PANTOPRAZOLE SOD DR 40 MG T: 40 | 30 days supply | Qty: 30 | Fill #2

## 2020-04-12 NOTE — Progress Notes (Addendum)
Name: William Lucero  MRN/ DOB: [id number], Oct 09, 1986    Age/ Sex: 34 y.o., male    PCP: Andres Shad, MD   Reason for Endocrinology Evaluation: Hx of hypogonadism     Date of Initial Endocrinology Evaluation: 04/12/2020     HPI: William Lucero is a 34 y.o. male with a past medical history of hypothyroidism. The patient presented for initial endocrinology clinic visit on 04/12/2020 for consultative assistance with his history of hypogonadism.   Pt was evaluated by [facility] in 04/2019 for evaluation of hypogonadism. His [place],. LH and prolactin levels were normal. His total testosterone came back at 247 ng/dL with normal Free testosterone at 13.6 pg/mL and low normal sex hormone binding globulin at 17.6 nmol/L ( 16.5-55.9) and normal TFT's. He was offered testosterone therapy based on the above readings but he was planning to have another child and treatment was put on hold.  His TFT's were normal and no LT_ 4 replacement was offered.    Dx with hypothyroidism in 2014 , In early 2020 he requested a testosterone check due to fatigue. Which was low  At 145 mg/dL   Recently has a baby ( 2 months ago ) has another 2.5 yr old biological child.   He continues with fatigue, decreased libido, has difficulty with erections  His weight has been increasing  Sleeps on average 6-8 hrs at night, when he is off work he sleeps all day and never feels rested , wakes up tired after 8 hours of sleep . No snoring , no reported apneic episodes    No OTC supplements  No mariguana No narcotic use.  No prior treatment with testosterone   He is on Wellbutrin for depression , and continues with mood swings and stress    Denies decreased shaving frequency   Does not smoke No FH of prostate cancer    Father with thyroid disease  HISTORY:  Past Medical History:  Past Medical History:  Diagnosis Date  . Acid reflux   . Depression   . Hypercholesteremia   .  Hypothyroidism   . Vitamin D deficiency    Past Surgical History:  Past Surgical History:  Procedure Laterality Date  . UPPER GASTROINTESTINAL ENDOSCOPY  04/2014   [place] with Dr.Panyda/Patel      Social History:  reports that he has never smoked. He has never used smokeless tobacco. He reports current alcohol use. He reports that he does not use drugs.  Family History: family history includes Atrial fibrillation in his mother; Depression in his mother; Diabetes in his father; Healthy in his brother, sister, and son; Hypertension in his father and mother; Hypothyroidism in his father.   HOME MEDICATIONS: Allergies as of 04/12/2020      Reactions   Sulfa Antibiotics Other (See Comments)   Causes a fever      Medication List       Accurate as of April 12, 2020 10:13 AM. If you have any questions, ask your nurse or doctor.        buPROPion 300 MG 24 hr tablet Commonly known as: WELLBUTRIN XL Take 300 mg by mouth daily.   fenofibrate 145 MG tablet Commonly known as: TRICOR Take 120 mg by mouth daily.   Fish Oil 1000 MG Cpdr Take by mouth daily.   levothyroxine 125 MCG tablet Commonly known as: SYNTHROID Take 137 mcg by mouth daily before breakfast.   meloxicam 7.5 MG tablet Commonly known as: Mobic Take 1 tablet (  7.5 mg total) by mouth daily.   multivitamin with minerals Tabs tablet Take 1 tablet by mouth daily.   niacin 500 MG tablet Take 500 mg by mouth 2 (two) times a day.   pantoprazole 40 MG tablet Commonly known as: PROTONIX TAKE 1 TABLET BY MOUTH DAILY BEFORE BREAKFAST.   Vitamin C 500 MG Caps Take by mouth.   VITAMIN D3 PO Take 5,000 Units by mouth daily.   Xyzal 5 MG tablet Generic drug: levocetirizine Take 5 mg by mouth every evening.   Zinc 50 MG Caps Take by mouth.         REVIEW OF SYSTEMS: A comprehensive ROS was conducted with the patient and is negative except as per HPI     OBJECTIVE:  VS: BP 118/84 (BP Location: Left  Arm, Patient Position: Sitting, Cuff Size: Large)   Pulse 92   Temp 98.1 F (36.7 C)   Ht 5\' 11"  (1.803 m)   Wt 222 lb 9.6 oz (101 kg)   SpO2 98%   BMI 31.05 kg/m    Wt Readings from Last 3 Encounters:  04/12/20 222 lb 9.6 oz (101 kg)  10/19/19 220 lb (99.8 kg)  09/07/19 220 lb (99.8 kg)     EXAM: General: Pt appears well and is in NAD  Eyes: External eye exam normal without stare, lid lag or exophthalmos.  EOM intact  Neck: General: Supple without adenopathy. Thyroid: Thyroid size normal.  No goiter or nodules appreciated. No thyroid bruit.  Lungs: Clear with good BS bilat with no rales, rhonchi, or wheezes  Chest : No gynecomastia   Heart: Auscultation: RRR.  Abdomen: Normoactive bowel sounds, soft, nontender, without masses or organomegaly palpable  Extremities:  BL LE: No pretibial edema normal ROM and strength.  Genitals: Normal external exam , testicular volume 25 mL  Skin: Hair: Texture and amount normal with gender appropriate distribution Skin Inspection: No rashes Skin Palpation: Skin temperature, texture, and thickness normal to palpation  Neuro: Cranial nerves: II - XII grossly intact  Motor: Normal strength throughout DTRs: 2+ and symmetric in UE without delay in relaxation phase  Mental Status: Judgment, insight: Intact Orientation: Oriented to time, place, and person Mood and affect: No depression, anxiety, or agitation     DATA REVIEWED: 03/22/2020 Testosterone 225 ng/dL   01/04/2020 Gluc 100 GFR 86 Tg 106 LDL 67 TSH 2.450    ASSESSMENT/PLAN/RECOMMENDATIONS:   1. Hx of hypogonadism :    - Pt with multiple non-specific symptoms that are multifactorial in nature - We discussed the importance of having labs in the morning and fasting, we also discussed the variability on testosterone essays and the preferred method is LCMS for total testosterone, I also explained to him that a low total testosterone in the 200's is most likely caused by low Sex  Hormone Binding Globulin (SHBG) which is caused by overweight/obesity , rather then an actual low testosterone level . I also explained to him that his ability to father a child is an indication of sufficient level  Of testosterone, as true hypogonadism would have caused low sperm count and infertility.  - But since conception happened in 2020 ( free testosterone was normal at the time) , will proceed with repeating labs at our clinic  - We discussed side effects of testosterone therapy such as erythropoiesis, worsening of sleep apnea that is severe and untreated,  Prostate volumes and serum PSA increase in response to testosterone treatment which might increase prostate cancer risk.  -  We also discussed there is a possibility of increased cardiovascular risk associated with testosterone use and VTE events.     2. Hashimoto's Thyroiditis:   - Pt with multiple non-specific symptoms that are not attributed to his thyroid .  - No local neck symptoms - He is biochemically euthyroid   Medications :  Continue Levothyroxine 125 mcg daily      3. Fatigue :    - Discussed D/D of depression and OSA. I have strongly recommended further discussion with his PCP about screening, I explained to his that even if testosterone therapy is needed, I would not be able to start him on it unless this is ruled out.    4. BMI 31:   - We discussed the importance of limiting caloric intake and avoiding sugar-sweetened beverages as a first line to improve his weight and lifestyle - Pt is unable to exercise due to fatigue and lack of motivation and its important to optimize chronic conditions.      F/u in 4 months     Signed electronically by: Mack Guise, MD  [facility]  [facility] [address] [place], [place] [address] Phone: [phone] FAX: [phone]   CC: Andres Shad, MD [address] Angelina [place] [address] Phone:  [phone] Fax: [phone]   Return to Endocrinology clinic as below: Future Appointments  Date Time Provider [place]  04/27/2020 10:30 AM Nida, Marella Chimes, MD [facility] None

## 2020-04-12 NOTE — Patient Instructions (Addendum)
-   Please stop by the labs for repeat testosterone testing at your convenience ( No later then 8 AM, please make sure you are fasting )  - I strongly recommend discussing sleep apnea screening with your PCP - I will not be able to start you on Testosterone ( if needed) without this being ruled out first   - Avoid all Sugar- sweetened beverages

## 2020-04-13 DIAGNOSIS — R5383 Other fatigue: Secondary | ICD-10-CM | POA: Insufficient documentation

## 2020-04-27 ENCOUNTER — Ambulatory Visit: Payer: 59 | Admitting: "Endocrinology

## 2020-04-30 MED FILL — LEVOTHYROXINE 137 MCG TAB: 137 | 30 days supply | Qty: 30 | Fill #3

## 2020-05-09 MED FILL — PANTOPRAZOLE SOD DR 40 MG T: 40 | 30 days supply | Qty: 30 | Fill #3

## 2020-05-09 MED FILL — BUPROPION HCL ER (XL) 300 M: 300 | 90 days supply | Qty: 90 | Fill #1

## 2020-05-09 MED FILL — FENOFIBRATE 134 MG CAPSULE: 134 | 90 days supply | Qty: 90 | Fill #1

## 2020-06-07 DIAGNOSIS — R21 Rash and other nonspecific skin eruption: Secondary | ICD-10-CM | POA: Diagnosis not present

## 2020-06-07 DIAGNOSIS — L249 Irritant contact dermatitis, unspecified cause: Secondary | ICD-10-CM | POA: Diagnosis not present

## 2020-06-11 DIAGNOSIS — L259 Unspecified contact dermatitis, unspecified cause: Secondary | ICD-10-CM | POA: Diagnosis not present

## 2020-06-15 ENCOUNTER — Other Ambulatory Visit (INDEPENDENT_AMBULATORY_CARE_PROVIDER_SITE_OTHER): Payer: Self-pay | Admitting: Internal Medicine

## 2020-06-19 MED FILL — PANTOPRAZOLE SOD DR 40 MG T: 40 | 30 days supply | Qty: 30 | Fill #0

## 2020-07-03 DIAGNOSIS — E039 Hypothyroidism, unspecified: Secondary | ICD-10-CM | POA: Diagnosis not present

## 2020-07-03 DIAGNOSIS — Z136 Encounter for screening for cardiovascular disorders: Secondary | ICD-10-CM | POA: Diagnosis not present

## 2020-07-03 DIAGNOSIS — E781 Pure hyperglyceridemia: Secondary | ICD-10-CM | POA: Diagnosis not present

## 2020-07-06 MED FILL — LEVOTHYROXINE 137 MCG TAB: 137 | 30 days supply | Qty: 30 | Fill #5

## 2020-07-16 DIAGNOSIS — F419 Anxiety disorder, unspecified: Secondary | ICD-10-CM | POA: Diagnosis not present

## 2020-07-16 DIAGNOSIS — K219 Gastro-esophageal reflux disease without esophagitis: Secondary | ICD-10-CM | POA: Diagnosis not present

## 2020-07-16 DIAGNOSIS — E039 Hypothyroidism, unspecified: Secondary | ICD-10-CM | POA: Diagnosis not present

## 2020-07-16 DIAGNOSIS — Z6831 Body mass index (BMI) 31.0-31.9, adult: Secondary | ICD-10-CM | POA: Diagnosis not present

## 2020-07-16 DIAGNOSIS — Z130 Encounter for screening for diseases of the blood and blood-forming organs and certain disorders involving the immune mechanism: Secondary | ICD-10-CM | POA: Diagnosis not present

## 2020-07-16 DIAGNOSIS — F341 Dysthymic disorder: Secondary | ICD-10-CM | POA: Diagnosis not present

## 2020-07-16 DIAGNOSIS — Z23 Encounter for immunization: Secondary | ICD-10-CM | POA: Diagnosis not present

## 2020-07-16 DIAGNOSIS — Z7689 Persons encountering health services in other specified circumstances: Secondary | ICD-10-CM | POA: Diagnosis not present

## 2020-07-16 DIAGNOSIS — Z713 Dietary counseling and surveillance: Secondary | ICD-10-CM | POA: Diagnosis not present

## 2020-07-18 MED FILL — PANTOPRAZOLE SOD DR 40 MG T: 40 | 30 days supply | Qty: 30 | Fill #1

## 2020-08-07 MED FILL — LEVOTHYROXINE 137 MCG TAB: 137 | 30 days supply | Qty: 30 | Fill #0

## 2020-08-07 MED FILL — FENOFIBRATE 134 MG CAPSULE: 134 | 90 days supply | Qty: 90 | Fill #0

## 2020-08-07 MED FILL — buPROPion HCL ER (XL) 300 M: 300 | 90 days supply | Qty: 90 | Fill #0

## 2020-08-13 MED FILL — PANTOPRAZOLE SOD DR 40 MG T: 40 | 30 days supply | Qty: 30 | Fill #2

## 2020-08-16 ENCOUNTER — Ambulatory Visit: Payer: 59 | Admitting: Internal Medicine

## 2020-08-25 DIAGNOSIS — E039 Hypothyroidism, unspecified: Secondary | ICD-10-CM | POA: Diagnosis not present

## 2020-08-25 DIAGNOSIS — N132 Hydronephrosis with renal and ureteral calculous obstruction: Secondary | ICD-10-CM | POA: Diagnosis not present

## 2020-08-25 DIAGNOSIS — N200 Calculus of kidney: Secondary | ICD-10-CM | POA: Diagnosis not present

## 2020-08-25 DIAGNOSIS — Z882 Allergy status to sulfonamides status: Secondary | ICD-10-CM | POA: Diagnosis not present

## 2020-08-25 DIAGNOSIS — Z79899 Other long term (current) drug therapy: Secondary | ICD-10-CM | POA: Diagnosis not present

## 2020-08-25 DIAGNOSIS — E785 Hyperlipidemia, unspecified: Secondary | ICD-10-CM | POA: Diagnosis not present

## 2020-08-25 DIAGNOSIS — R1030 Lower abdominal pain, unspecified: Secondary | ICD-10-CM | POA: Diagnosis not present

## 2020-08-25 DIAGNOSIS — K760 Fatty (change of) liver, not elsewhere classified: Secondary | ICD-10-CM | POA: Diagnosis not present

## 2020-08-31 ENCOUNTER — Ambulatory Visit
Admission: EM | Admit: 2020-08-31 | Discharge: 2020-08-31 | Disposition: A | Discharge status: Home / Self Care | Payer: 59 | Attending: Emergency Medicine | Admitting: Emergency Medicine

## 2020-08-31 ENCOUNTER — Other Ambulatory Visit: Payer: Self-pay

## 2020-08-31 DIAGNOSIS — J019 Acute sinusitis, unspecified: Secondary | ICD-10-CM

## 2020-08-31 DIAGNOSIS — Z1152 Encounter for screening for COVID-19: Secondary | ICD-10-CM

## 2020-08-31 DIAGNOSIS — J069 Acute upper respiratory infection, unspecified: Secondary | ICD-10-CM | POA: Diagnosis not present

## 2020-08-31 DIAGNOSIS — Z20822 Contact with and (suspected) exposure to covid-19: Secondary | ICD-10-CM

## 2020-08-31 MED ORDER — AMOXICILLIN-POT CLAVULANATE 875-125 MG PO TABS: 1.0000 | ORAL_TABLET | Freq: Two times a day (BID) | ORAL | 0 refills | Status: AC

## 2020-08-31 MED ORDER — CETIRIZINE-PSEUDOEPHEDRINE ER 5-120 MG PO TB12
1.0000 | ORAL_TABLET | Freq: Every day | ORAL | 0 refills | Status: DC
Start: 2020-08-31 — End: 2021-08-25

## 2020-08-31 NOTE — Discharge Instructions (Signed)
COVID testing ordered.  It will take between 5-7 days for test results.  Someone will contact you regarding abnormal results.    In the meantime: If positive; You should remain isolated in your home for 10 days from symptom onset AND greater than 72 hours after symptoms resolution (absence of fever without the use of fever-reducing medication and improvement in respiratory symptoms), whichever is longer Get plenty of rest and push fluids Zyrtec d for congestion and sore throat Use medications daily for symptom relief Use OTC medications like ibuprofen or tylenol as needed fever or pain Call or go to the ED if you have any new or worsening symptoms such as fever, cough, shortness of breath, chest tightness, chest pain, turning blue, changes in mental status, etc..Marland Kitchen

## 2020-08-31 NOTE — ED Triage Notes (Signed)
Pt presents with nasal congestion and sore throat that began a few days , pts son positive for RSV

## 2020-08-31 NOTE — ED Provider Notes (Signed)
[place]   [id number] 08/31/20 Arrival Time: 5621   CC: COVID symptoms  SUBJECTIVE: History from: patient.  Dion Sibal is a 34 y.o. male who presents with sinus pain/ pressure, sore throat and nasal congestion x 6 days.  Admits to exposure to RSV.  Denies alleviating factors.  Worse with leaning forward.  Denies fever, chills, SOB, wheezing, chest pain, nausea, changes in bowel or bladder habits.     ROS: As per HPI.  All other pertinent ROS negative.     Past Medical History:  Diagnosis Date   Acid reflux    Depression    Hypercholesteremia    Hypothyroidism    Vitamin D deficiency    Past Surgical History:  Procedure Laterality Date   UPPER GASTROINTESTINAL ENDOSCOPY  04/2014   [place] with Dr.Panyda/Patel   Allergies  Allergen Reactions   Sulfa Antibiotics Other (See Comments)    Causes a fever    No current facility-administered medications on file prior to encounter.   Current Outpatient Medications on File Prior to Encounter  Medication Sig Dispense Refill   Ascorbic Acid (VITAMIN C) 500 MG CAPS Take by mouth.     buPROPion (WELLBUTRIN XL) 300 MG 24 hr tablet Take 300 mg by mouth daily.     Cholecalciferol (VITAMIN D3 PO) Take 5,000 Units by mouth daily.     fenofibrate (TRICOR) 145 MG tablet Take 120 mg by mouth daily.      levocetirizine (XYZAL) 5 MG tablet Take 5 mg by mouth every evening.     levothyroxine (SYNTHROID) 125 MCG tablet Take 137 mcg by mouth daily before breakfast.      meloxicam (MOBIC) 7.5 MG tablet Take 1 tablet (7.5 mg total) by mouth daily. (Patient not taking: Reported on 04/12/2020) 30 tablet 5   Multiple Vitamin (MULTIVITAMIN WITH MINERALS) TABS tablet Take 1 tablet by mouth daily.     niacin 500 MG tablet Take 500 mg by mouth 2 (two) times a day.     Omega-3 Fatty Acids (FISH OIL) 1000 MG CPDR Take by mouth daily.     pantoprazole (PROTONIX) 40 MG tablet TAKE 1 TABLET BY MOUTH DAILY BEFORE BREAKFAST.  30 tablet 3   Zinc 50 MG CAPS Take by mouth.     Social History   Socioeconomic History   Marital status: Married    Spouse name: Not on file   Number of children: Not on file   Years of education: Not on file   Highest education level: Not on file  Occupational History   Not on file  Tobacco Use   Smoking status: Never Smoker   Smokeless tobacco: Never Used  Vaping Use   Vaping Use: Never used  Substance and Sexual Activity   Alcohol use: Yes    Comment: socially   Drug use: Never   Sexual activity: Not on file  Other Topics Concern   Not on file  Social History Narrative   Not on file   Social Determinants of Health   Financial Resource Strain:    Difficulty of Paying Living Expenses: Not on file  Food Insecurity:    Worried About [place] in the Last Year: Not on file   Ran Out of Food in the Last Year: Not on file  Transportation Needs:    Lack of Transportation (Medical): Not on file   Lack of Transportation (Non-Medical): Not on file  Physical Activity:    Days of Exercise per Week: Not on file  Minutes of Exercise per Session: Not on file  Stress:    Feeling of Stress : Not on file  Social Connections:    Frequency of Communication with Friends and Family: Not on file   Frequency of Social Gatherings with Friends and Family: Not on file   Attends Religious Services: Not on file   Active Member of Clubs or Organizations: Not on file   Attends Archivist Meetings: Not on file   Marital Status: Not on file  Intimate Partner Violence:    Fear of Current or Ex-Partner: Not on file   Emotionally Abused: Not on file   Physically Abused: Not on file   Sexually Abused: Not on file   Family History  Problem Relation Age of Onset   Depression Mother    Hypertension Mother    Atrial fibrillation Mother    Hypothyroidism Father    Hypertension Father    Diabetes Father    Healthy Sister    Healthy  Brother    Healthy Son     OBJECTIVE:  Vitals:   08/31/20 1702  BP: 123/86  Pulse: (!) 109  Resp: 20  Temp: 99.5 F (37.5 C)  SpO2: 96%     General appearance: alert; appears fatigued, but nontoxic; speaking in full sentences and tolerating own secretions HEENT: NCAT; Ears: EACs clear, TMs pearly gray; Eyes: PERRL.  EOM grossly intact. Sinuses: TTP; Nose: nares patent without rhinorrhea, Throat: oropharynx clear, tonsils non erythematous or enlarged, uvula midline  Neck: supple without LAD Lungs: unlabored respirations, symmetrical air entry; cough: absent; no respiratory distress; CTAB Heart: regular rate and rhythm.   Skin: warm and dry Psychological: alert and cooperative; normal mood and affect  ASSESSMENT & PLAN:  1. Encounter for screening for COVID-19   2. Viral URI   3. Suspected COVID-19 virus infection   4. Acute non-recurrent sinusitis, unspecified location     Meds ordered this encounter  Medications   cetirizine-pseudoephedrine (ZYRTEC-D) 5-120 MG tablet    Sig: Take 1 tablet by mouth daily.    Dispense:  30 tablet    Refill:  0    Order Specific Question:   Supervising Provider    Answer:   Raylene Everts [[id number]]   amoxicillin-clavulanate (AUGMENTIN) 875-125 MG tablet    Sig: Take 1 tablet by mouth every 12 (twelve) hours for 10 days.    Dispense:  20 tablet    Refill:  0    Order Specific Question:   Supervising Provider    Answer:   Raylene Everts [[id number]]   COVID testing ordered.  It will take between 5-7 days for test results.  Someone will contact you regarding abnormal results.    In the meantime: If positive; You should remain isolated in your home for 10 days from symptom onset AND greater than 72 hours after symptoms resolution (absence of fever without the use of fever-reducing medication and improvement in respiratory symptoms), whichever is longer Get plenty of rest and push fluids Zyrtec d for congestion and sore throat Use  medications daily for symptom relief Use OTC medications like ibuprofen or tylenol as needed fever or pain Call or go to the ED if you have any new or worsening symptoms such as fever, cough, shortness of breath, chest tightness, chest pain, turning blue, changes in mental status, etc...   Augmentin prescribed for sinus infection  Reviewed expectations re: course of current medical issues. Questions answered. Outlined signs and symptoms indicating need for more  acute intervention. Patient verbalized understanding. After Visit Summary given.         Lestine Box, PA-C 08/31/20 1737

## 2020-09-03 LAB — NOVEL CORONAVIRUS, NAA: SARS-CoV-2, NAA: NOT DETECTED

## 2020-09-07 IMAGING — CT CT ABD-PELV W/ CM
2 of 4 series · 17 of 46 positions shown, 19 images · IV contrast (Isovue)
Comparison: None.

CLINICAL DATA: Right lower abdominal pain worse after eating.

EXAM:
CT ABDOMEN AND PELVIS WITH CONTRAST
TECHNIQUE: Multidetector CT imaging of the abdomen and pelvis was performed
using the standard protocol following bolus administration of
intravenous contrast.
CONTRAST:  100mL OMNIPAQUE IOHEXOL 300 MG/ML  SOLN

[Series 2: axial st · axial · 0.83mm/px · z∈[+944,+1399]mm · 14 of 99 slices shown, 16 images]
[im 4/99  soft-tissue]
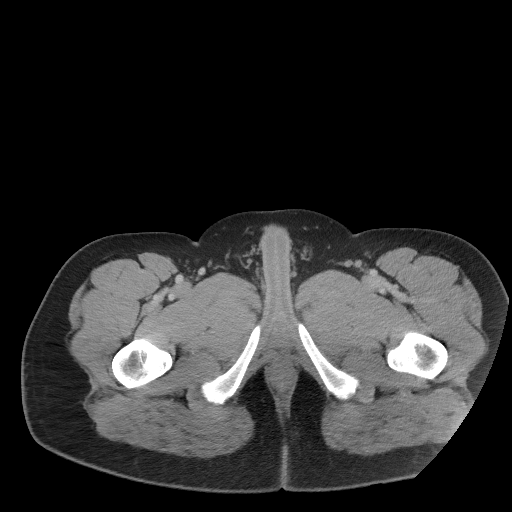
[im 4/99  bone]
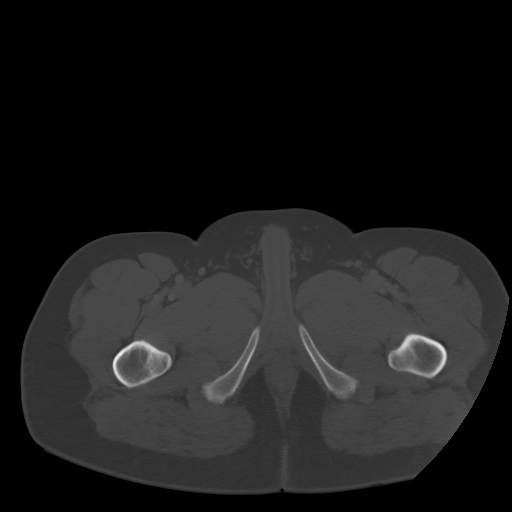
[im 12/99  soft-tissue]
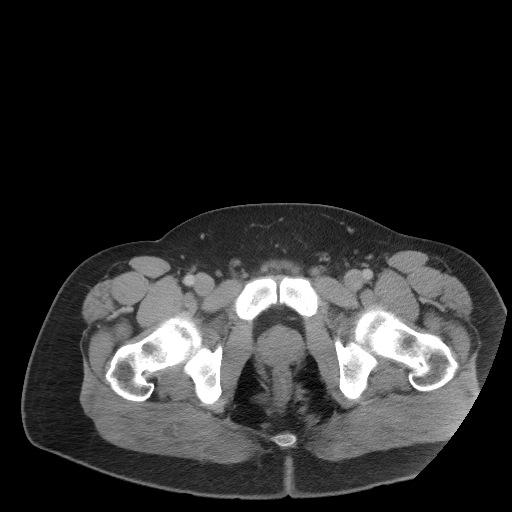
[im 20/99  soft-tissue]
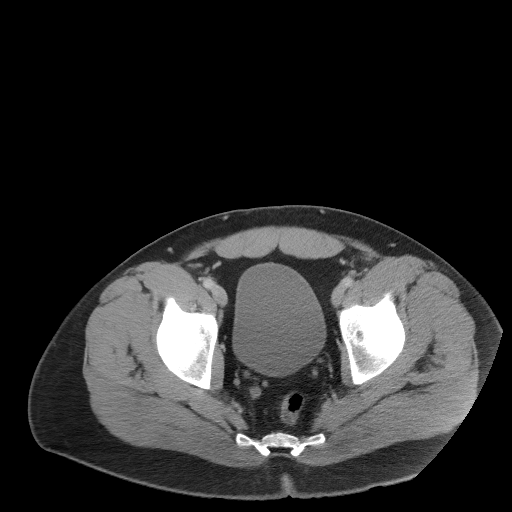
[im 28/99  soft-tissue]
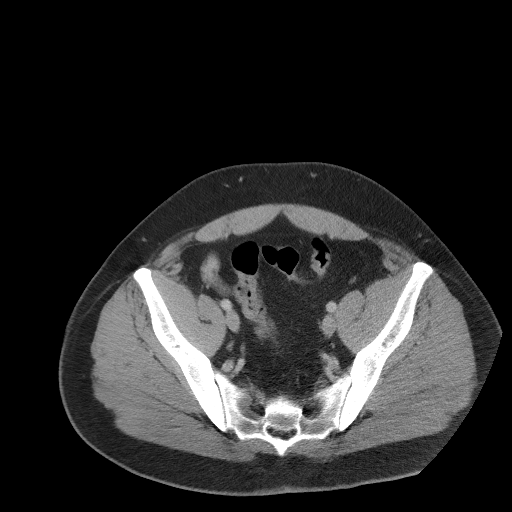
[im 32/99  soft-tissue]
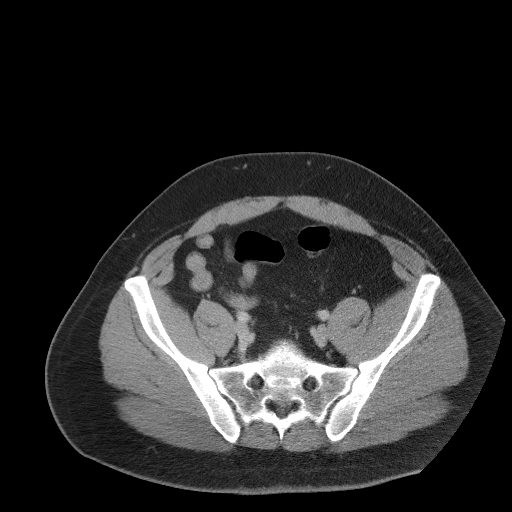
[im 40/99  soft-tissue]
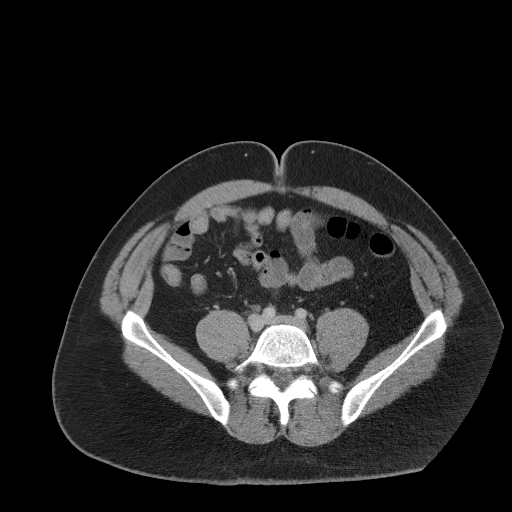
[im 48/99  soft-tissue]
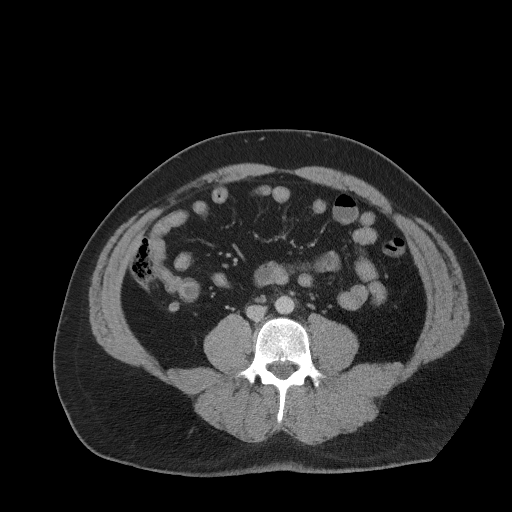
[im 51/99  soft-tissue]
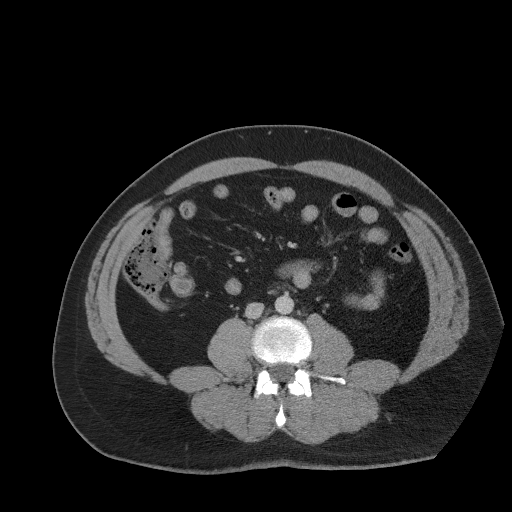
[im 59/99  soft-tissue]
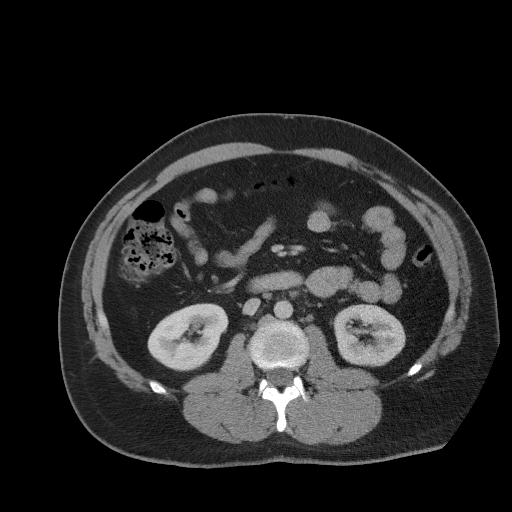
[im 59/99  bone]
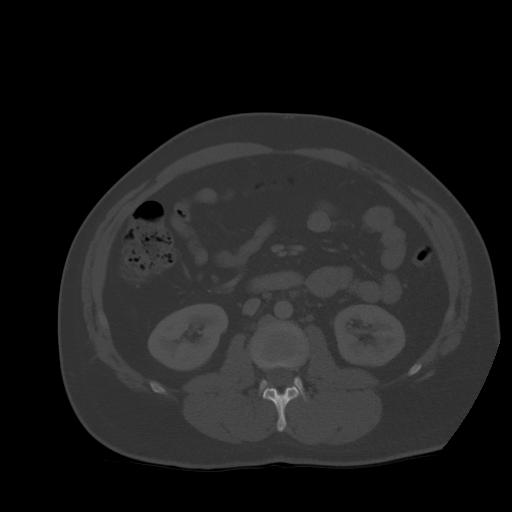
[im 67/99  soft-tissue]
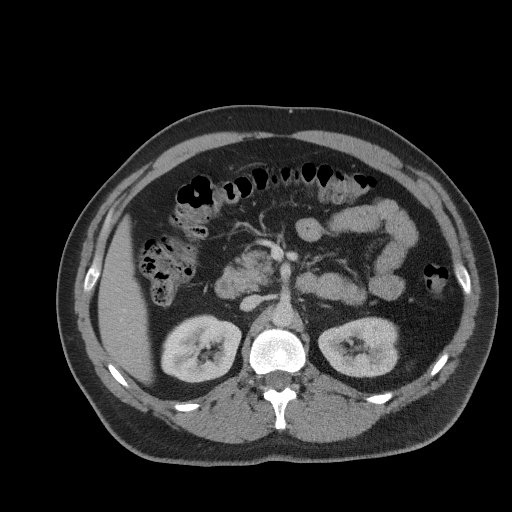
[im 75/99  soft-tissue]
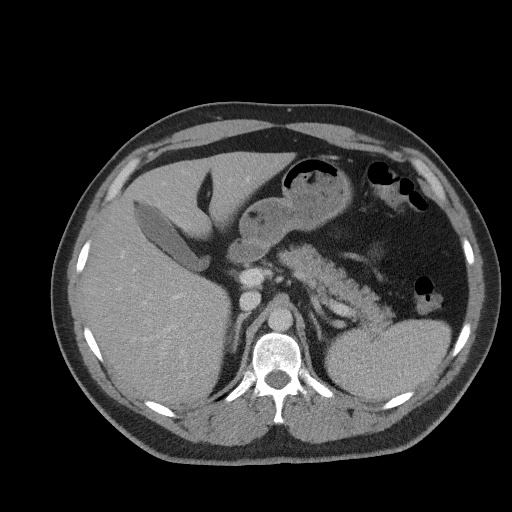
[im 79/99  soft-tissue]
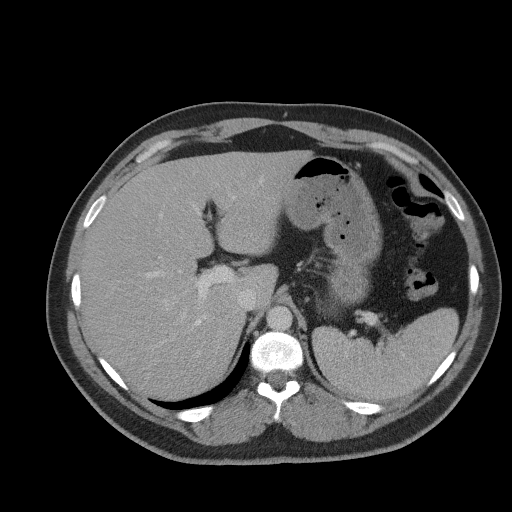
[im 87/99  soft-tissue]
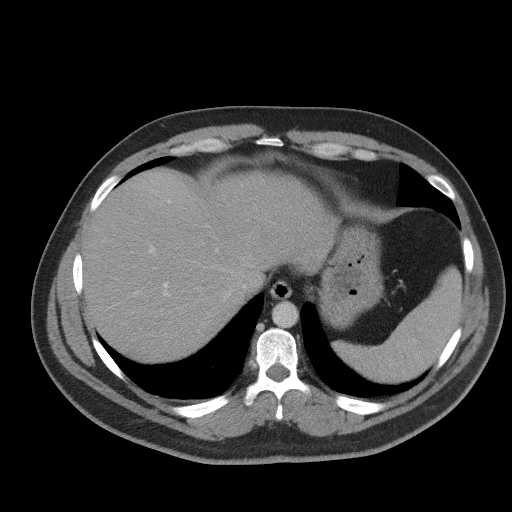
[im 95/99  soft-tissue]
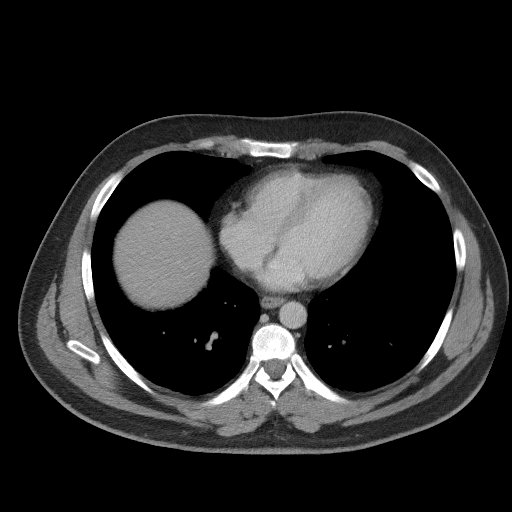

[Series 5: coronal st · coronal · 0.86mm/px · 3 of 99 slices shown]
[im 33/99  soft-tissue]
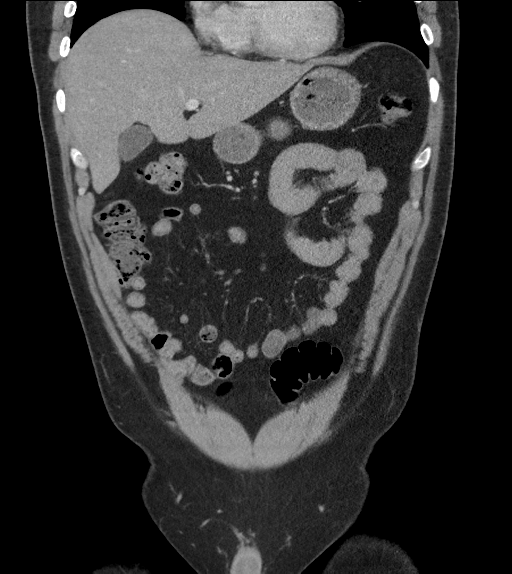
[im 44/99  soft-tissue]
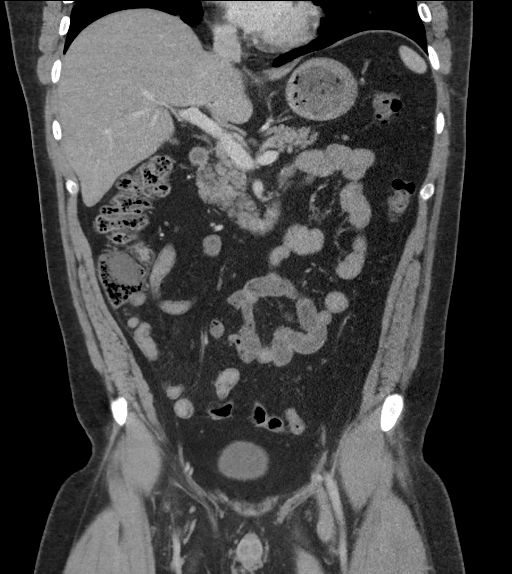
[im 55/99  soft-tissue]
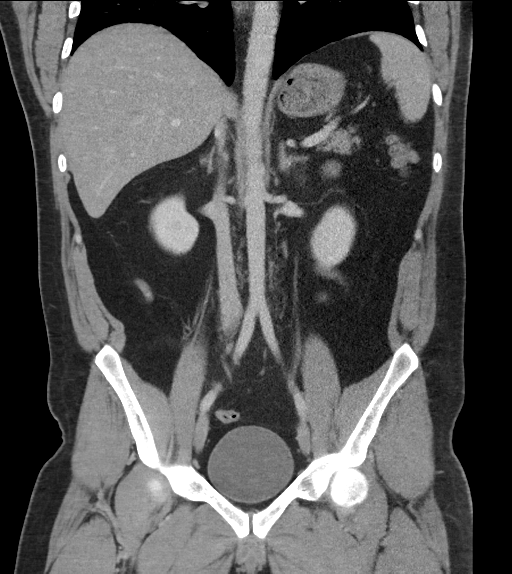

[17 of 46 positions shown; findings below may reference images not displayed]

FINDINGS: Lower chest: Atelectasis in the lung bases.

Hepatobiliary: No focal liver abnormality is seen. No gallstones,
gallbladder wall thickening, or biliary dilatation.

Pancreas: Unremarkable. No pancreatic ductal dilatation or
surrounding inflammatory changes.

Spleen: Normal in size without focal abnormality.

Adrenals/Urinary Tract: Adrenal glands are unremarkable. Kidneys are
normal, without renal calculi, focal lesion, or hydronephrosis.
Bladder is unremarkable.

Stomach/Bowel: Stomach is within normal limits. Appendix appears
normal. No evidence of bowel wall thickening, distention, or
inflammatory changes.

Vascular/Lymphatic: No significant vascular findings are present. No
enlarged abdominal or pelvic lymph nodes.

Reproductive: Prostate is unremarkable.

Other: No abdominal wall hernia or abnormality. No abdominopelvic
ascites.

Musculoskeletal: No acute or significant osseous findings.
IMPRESSION: No acute process demonstrated in the abdomen or pelvis. No evidence
of bowel obstruction or inflammation.

## 2020-12-13 ENCOUNTER — Other Ambulatory Visit: Payer: Self-pay | Admitting: Orthopedic Surgery

## 2020-12-13 DIAGNOSIS — G8929 Other chronic pain: Secondary | ICD-10-CM

## 2021-05-15 DIAGNOSIS — E785 Hyperlipidemia, unspecified: Secondary | ICD-10-CM | POA: Diagnosis not present

## 2021-05-15 DIAGNOSIS — E039 Hypothyroidism, unspecified: Secondary | ICD-10-CM | POA: Diagnosis not present

## 2021-05-15 DIAGNOSIS — R739 Hyperglycemia, unspecified: Secondary | ICD-10-CM | POA: Diagnosis not present

## 2021-05-15 DIAGNOSIS — E781 Pure hyperglyceridemia: Secondary | ICD-10-CM | POA: Diagnosis not present

## 2021-08-25 ENCOUNTER — Emergency Department (HOSPITAL_COMMUNITY): Payer: BC Managed Care – PPO | Admitting: Anesthesiology

## 2021-08-25 ENCOUNTER — Ambulatory Visit (HOSPITAL_COMMUNITY)
Admission: EM | Admit: 2021-08-25 | Discharge: 2021-08-25 | Disposition: A | Discharge status: Home / Self Care | Payer: BC Managed Care – PPO | Attending: General Surgery | Admitting: General Surgery

## 2021-08-25 ENCOUNTER — Encounter (HOSPITAL_COMMUNITY)
Admission: EM | Disposition: A | Discharge status: Home / Self Care | Payer: Self-pay | Source: Home / Self Care | Attending: Emergency Medicine

## 2021-08-25 ENCOUNTER — Emergency Department (HOSPITAL_COMMUNITY): Payer: BC Managed Care – PPO

## 2021-08-25 ENCOUNTER — Encounter (HOSPITAL_COMMUNITY): Payer: Self-pay | Admitting: Emergency Medicine

## 2021-08-25 ENCOUNTER — Other Ambulatory Visit: Payer: Self-pay

## 2021-08-25 DIAGNOSIS — K3533 Acute appendicitis with perforation and localized peritonitis, with abscess: Secondary | ICD-10-CM | POA: Diagnosis not present

## 2021-08-25 DIAGNOSIS — K353 Acute appendicitis with localized peritonitis, without perforation or gangrene: Secondary | ICD-10-CM | POA: Diagnosis not present

## 2021-08-25 DIAGNOSIS — Z79890 Hormone replacement therapy (postmenopausal): Secondary | ICD-10-CM | POA: Insufficient documentation

## 2021-08-25 DIAGNOSIS — Z20822 Contact with and (suspected) exposure to covid-19: Secondary | ICD-10-CM | POA: Insufficient documentation

## 2021-08-25 DIAGNOSIS — K3580 Unspecified acute appendicitis: Secondary | ICD-10-CM | POA: Diagnosis present

## 2021-08-25 DIAGNOSIS — Z882 Allergy status to sulfonamides status: Secondary | ICD-10-CM | POA: Diagnosis not present

## 2021-08-25 DIAGNOSIS — Z79899 Other long term (current) drug therapy: Secondary | ICD-10-CM | POA: Insufficient documentation

## 2021-08-25 HISTORY — PX: LAPAROSCOPIC APPENDECTOMY: SHX408

## 2021-08-25 LAB — COMPREHENSIVE METABOLIC PANEL
ALT: 37 U/L (ref 0–44)
AST: 21 U/L (ref 15–41)
Albumin: 4.3 g/dL (ref 3.5–5.0)
Alkaline Phosphatase: 47 U/L (ref 38–126)
Anion gap: 7 (ref 5–15)
BUN: 14 mg/dL (ref 6–20)
CO2: 26 mmol/L (ref 22–32)
Calcium: 9 mg/dL (ref 8.9–10.3)
Chloride: 104 mmol/L (ref 98–111)
Creatinine, Ser: 1.27 mg/dL — ABNORMAL HIGH (ref 0.61–1.24)
GFR, Estimated: >60 mL/min (ref >60)
Glucose, Bld: 91 mg/dL (ref 70–99)
Potassium: 3.6 mmol/L (ref 3.5–5.1)
Sodium: 137 mmol/L (ref 135–145)
Total Bilirubin: 0.7 mg/dL (ref 0.3–1.2)
Total Protein: 7 g/dL (ref 6.5–8.1)

## 2021-08-25 LAB — CBC
HCT: 41.7 % (ref 39.0–52.0)
Hemoglobin: 13.6 g/dL (ref 13.0–17.0)
MCH: 29.1 pg (ref 26.0–34.0)
MCHC: 32.6 g/dL (ref 30.0–36.0)
MCV: 89.3 fL (ref 80.0–100.0)
Platelets: 279 10*3/uL (ref 150–400)
RBC: 4.67 MIL/uL (ref 4.22–5.81)
RDW: 12.1 % (ref 11.5–15.5)
WBC: 7.6 10*3/uL (ref 4.0–10.5)
nRBC: 0 % (ref 0.0–0.2)

## 2021-08-25 LAB — URINALYSIS, ROUTINE W REFLEX MICROSCOPIC
Bilirubin Urine: NEGATIVE
Glucose, UA: NEGATIVE mg/dL
Hgb urine dipstick: NEGATIVE
Ketones, ur: NEGATIVE mg/dL
Leukocytes,Ua: NEGATIVE
Nitrite: NEGATIVE
Protein, ur: NEGATIVE mg/dL
Specific Gravity, Urine: 1.02 (ref 1.005–1.030)
pH: 6.5 (ref 5.0–8.0)

## 2021-08-25 LAB — RESP PANEL BY RT-PCR (FLU A&B, COVID) ARPGX2
Influenza A by PCR: NEGATIVE
Influenza B by PCR: NEGATIVE
SARS Coronavirus 2 by RT PCR: NEGATIVE

## 2021-08-25 LAB — LIPASE, BLOOD: Lipase: 28 U/L (ref 11–51)

## 2021-08-25 SURGERY — APPENDECTOMY, LAPAROSCOPIC
Anesthesia: General | Site: Abdomen

## 2021-08-25 MED ORDER — LACTATED RINGERS IV SOLN: INTRAVENOUS | Status: DC

## 2021-08-25 MED ORDER — SUCCINYLCHOLINE CHLORIDE 200 MG/10ML IV SOSY
PREFILLED_SYRINGE | INTRAVENOUS | Status: DC | PRN
  Administered 2021-08-25: 100 mg via INTRAVENOUS

## 2021-08-25 MED ORDER — LIDOCAINE VISCOUS HCL 2 % MT SOLN
15.0000 mL | Freq: Once | OROMUCOSAL | Status: AC
  Administered 2021-08-25: 15 mL via ORAL
  Filled 2021-08-25: qty 15

## 2021-08-25 MED ORDER — CHLORHEXIDINE GLUCONATE CLOTH 2 % EX PADS: 6.0000 | MEDICATED_PAD | Freq: Once | CUTANEOUS | Status: DC

## 2021-08-25 MED ORDER — BUPIVACAINE LIPOSOME 1.3 % IJ SUSP
INTRAMUSCULAR | Status: DC | PRN
  Administered 2021-08-25: 20 mL

## 2021-08-25 MED ORDER — KETOROLAC TROMETHAMINE 30 MG/ML IJ SOLN
30.0000 mg | Freq: Once | INTRAMUSCULAR | Status: AC
  Administered 2021-08-25: 30 mg via INTRAVENOUS

## 2021-08-25 MED ORDER — LACTATED RINGERS IV SOLN: INTRAVENOUS | Status: DC | PRN

## 2021-08-25 MED ORDER — ALUM & MAG HYDROXIDE-SIMETH 200-200-20 MG/5ML PO SUSP
30.0000 mL | Freq: Once | ORAL | Status: AC
  Administered 2021-08-25: 30 mL via ORAL
  Filled 2021-08-25: qty 30

## 2021-08-25 MED ORDER — FENTANYL CITRATE PF 50 MCG/ML IJ SOSY
25.0000 ug | PREFILLED_SYRINGE | INTRAMUSCULAR | Status: DC | PRN
  Administered 2021-08-25: 50 ug via INTRAVENOUS

## 2021-08-25 MED ORDER — SUGAMMADEX SODIUM 200 MG/2ML IV SOLN
INTRAVENOUS | Status: DC | PRN
  Administered 2021-08-25: 200 mg via INTRAVENOUS

## 2021-08-25 MED ORDER — CHLORHEXIDINE GLUCONATE 0.12 % MT SOLN: 15.0000 mL | Freq: Once | OROMUCOSAL | Status: DC

## 2021-08-25 MED ORDER — PIPERACILLIN-TAZOBACTAM 3.375 G IVPB 30 MIN
3.3750 g | Freq: Once | INTRAVENOUS | Status: AC
  Administered 2021-08-25: 3.375 g via INTRAVENOUS
  Filled 2021-08-25: qty 50

## 2021-08-25 MED ORDER — HYDROCODONE-ACETAMINOPHEN 5-325 MG PO TABS
ORAL_TABLET | ORAL | Status: AC
  Filled 2021-08-25: qty 1

## 2021-08-25 MED ORDER — FENTANYL CITRATE PF 50 MCG/ML IJ SOSY
PREFILLED_SYRINGE | INTRAMUSCULAR | Status: AC
  Filled 2021-08-25: qty 1

## 2021-08-25 MED ORDER — HYDROCODONE-ACETAMINOPHEN 5-325 MG PO TABS: 1.0000 | ORAL_TABLET | ORAL | 0 refills | Status: DC | PRN

## 2021-08-25 MED ORDER — HYDROCODONE-ACETAMINOPHEN 5-325 MG PO TABS
1.0000 | ORAL_TABLET | Freq: Once | ORAL | Status: AC
  Administered 2021-08-25: 1 via ORAL

## 2021-08-25 MED ORDER — FENTANYL CITRATE (PF) 100 MCG/2ML IJ SOLN
INTRAMUSCULAR | Status: AC
  Filled 2021-08-25: qty 2

## 2021-08-25 MED ORDER — PROPOFOL 10 MG/ML IV BOLUS
INTRAVENOUS | Status: DC | PRN
  Administered 2021-08-25: 200 mg via INTRAVENOUS
  Administered 2021-08-25: 100 mg via INTRAVENOUS

## 2021-08-25 MED ORDER — DICYCLOMINE HCL 10 MG/ML IM SOLN
20.0000 mg | Freq: Once | INTRAMUSCULAR | Status: AC
  Administered 2021-08-25: 20 mg via INTRAMUSCULAR
  Filled 2021-08-25: qty 2

## 2021-08-25 MED ORDER — PROPOFOL 10 MG/ML IV BOLUS
INTRAVENOUS | Status: AC
  Filled 2021-08-25: qty 20

## 2021-08-25 MED ORDER — ORAL CARE MOUTH RINSE: 15.0000 mL | Freq: Once | OROMUCOSAL | Status: DC

## 2021-08-25 MED ORDER — ONDANSETRON HCL 4 MG/2ML IJ SOLN
INTRAMUSCULAR | Status: AC
  Filled 2021-08-25: qty 2

## 2021-08-25 MED ORDER — SODIUM CHLORIDE 0.9 % IR SOLN
Status: DC | PRN
  Administered 2021-08-25: 1000 mL

## 2021-08-25 MED ORDER — KETOROLAC TROMETHAMINE 30 MG/ML IJ SOLN
INTRAMUSCULAR | Status: AC
  Filled 2021-08-25: qty 1

## 2021-08-25 MED ORDER — MIDAZOLAM HCL 2 MG/2ML IJ SOLN
INTRAMUSCULAR | Status: AC
  Filled 2021-08-25: qty 2

## 2021-08-25 MED ORDER — BUPIVACAINE LIPOSOME 1.3 % IJ SUSP
INTRAMUSCULAR | Status: AC
  Filled 2021-08-25: qty 20

## 2021-08-25 MED ORDER — IOHEXOL 350 MG/ML SOLN
100.0000 mL | Freq: Once | INTRAVENOUS | Status: AC | PRN
  Administered 2021-08-25: 75 mL via INTRAVENOUS

## 2021-08-25 MED ORDER — FENTANYL CITRATE (PF) 100 MCG/2ML IJ SOLN
INTRAMUSCULAR | Status: DC | PRN
  Administered 2021-08-25: 100 ug via INTRAVENOUS

## 2021-08-25 MED ORDER — MIDAZOLAM HCL 5 MG/5ML IJ SOLN
INTRAMUSCULAR | Status: DC | PRN
  Administered 2021-08-25: 2 mg via INTRAVENOUS

## 2021-08-25 MED ORDER — ONDANSETRON HCL 4 MG/2ML IJ SOLN
4.0000 mg | Freq: Once | INTRAMUSCULAR | Status: AC | PRN
  Administered 2021-08-25: 4 mg via INTRAVENOUS

## 2021-08-25 MED ORDER — SUCRALFATE 1 GM/10ML PO SUSP
1.0000 g | Freq: Once | ORAL | Status: AC
  Administered 2021-08-25: 1 g via ORAL
  Filled 2021-08-25: qty 10

## 2021-08-25 SURGICAL SUPPLY — 41 items
BAG RETRIEVAL 10 (BASKET) ×1
CHLORAPREP W/TINT 26 (MISCELLANEOUS) ×2 IMPLANT
CLOTH BEACON ORANGE TIMEOUT ST (SAFETY) ×2 IMPLANT
COVER LIGHT HANDLE STERIS (MISCELLANEOUS) ×4 IMPLANT
CUTTER FLEX LINEAR 45M (STAPLE) ×2 IMPLANT
DERMABOND ADVANCED (GAUZE/BANDAGES/DRESSINGS) ×1
DERMABOND ADVANCED .7 DNX12 (GAUZE/BANDAGES/DRESSINGS) ×1 IMPLANT
ELECT REM PT RETURN 9FT ADLT (ELECTROSURGICAL) ×2
ELECTRODE REM PT RTRN 9FT ADLT (ELECTROSURGICAL) ×1 IMPLANT
GAUZE 4X4 16PLY ~~LOC~~+RFID DBL (SPONGE) ×2 IMPLANT
GLOVE SURG POLYISO LF SZ7.5 (GLOVE) ×2 IMPLANT
GLOVE SURG UNDER POLY LF SZ7 (GLOVE) ×6 IMPLANT
GOWN STRL REUS W/TWL LRG LVL3 (GOWN DISPOSABLE) ×4 IMPLANT
INST SET LAPROSCOPIC AP (KITS) ×2 IMPLANT
KIT TURNOVER KIT A (KITS) ×2 IMPLANT
MANIFOLD NEPTUNE II (INSTRUMENTS) ×2 IMPLANT
NEEDLE HYPO 18GX1.5 BLUNT FILL (NEEDLE) ×2 IMPLANT
NEEDLE HYPO 21X1.5 SAFETY (NEEDLE) ×2 IMPLANT
NEEDLE INSUFFLATION 14GA 120MM (NEEDLE) ×2 IMPLANT
NS IRRIG 1000ML POUR BTL (IV SOLUTION) ×2 IMPLANT
PACK LAP CHOLE LZT030E (CUSTOM PROCEDURE TRAY) ×2 IMPLANT
PAD ARMBOARD 7.5X6 YLW CONV (MISCELLANEOUS) ×2 IMPLANT
PENCIL SMOKE EVACUATOR COATED (MISCELLANEOUS) ×2 IMPLANT
RELOAD 45 VASCULAR/THIN (ENDOMECHANICALS) ×2 IMPLANT
RELOAD STAPLE TA45 3.5 REG BLU (ENDOMECHANICALS) IMPLANT
SET BASIN LINEN APH (SET/KITS/TRAYS/PACK) ×2 IMPLANT
SET TUBE IRRIG SUCTION NO TIP (IRRIGATION / IRRIGATOR) IMPLANT
SET TUBE SMOKE EVAC HIGH FLOW (TUBING) ×2 IMPLANT
SHEARS HARMONIC ACE PLUS 36CM (ENDOMECHANICALS) ×2 IMPLANT
SUT MNCRL AB 4-0 PS2 18 (SUTURE) ×4 IMPLANT
SUT VICRYL 0 UR6 27IN ABS (SUTURE) ×2 IMPLANT
SYR 20ML LL LF (SYRINGE) ×4 IMPLANT
SYS BAG RETRIEVAL 10MM (BASKET) ×1
SYSTEM BAG RETRIEVAL 10MM (BASKET) ×1 IMPLANT
TRAY FOLEY W/BAG SLVR 16FR (SET/KITS/TRAYS/PACK) ×1
TRAY FOLEY W/BAG SLVR 16FR ST (SET/KITS/TRAYS/PACK) ×1 IMPLANT
TROCAR ENDO BLADELESS 11MM (ENDOMECHANICALS) ×2 IMPLANT
TROCAR ENDO BLADELESS 12MM (ENDOMECHANICALS) ×2 IMPLANT
TROCAR XCEL NON-BLD 5MMX100MML (ENDOMECHANICALS) ×2 IMPLANT
TUBE CONNECTING 12X1/4 (SUCTIONS) ×2 IMPLANT
WARMER LAPAROSCOPE (MISCELLANEOUS) ×2 IMPLANT

## 2021-08-25 NOTE — Transfer of Care (Signed)
Immediate Anesthesia Transfer of Care Note  Patient: William Lucero  Procedure(s) Performed: APPENDECTOMY LAPAROSCOPIC (Abdomen)  Patient Location: PACU  Anesthesia Type:General  Level of Consciousness: awake, alert  and oriented  Airway & Oxygen Therapy: Patient Spontanous Breathing and Patient connected to nasal cannula oxygen  Post-op Assessment: Report given to RN and Post -op Vital signs reviewed and stable  Post vital signs: Reviewed and stable  Last Vitals:  Vitals Value Taken Time  BP    Temp    Pulse    Resp    SpO2      Last Pain:  Vitals:   08/25/21 1257  TempSrc:   PainSc: 3          Complications: No notable events documented.

## 2021-08-25 NOTE — Anesthesia Preprocedure Evaluation (Signed)
Anesthesia Evaluation  Patient identified by MRN, date of birth, ID band Patient awake    Reviewed: Allergy & Precautions, H&P , NPO status , Patient's Chart, lab work & pertinent test results, reviewed documented beta blocker date and time   Airway Mallampati: II  TM Distance: >3 FB Neck ROM: full    Dental no notable dental hx.    Pulmonary neg pulmonary ROS,    Pulmonary exam normal breath sounds clear to auscultation       Cardiovascular Exercise Tolerance: Good negative cardio ROS   Rhythm:regular Rate:Normal     Neuro/Psych PSYCHIATRIC DISORDERS Depression negative neurological ROS     GI/Hepatic Neg liver ROS, GERD  Medicated,  Endo/Other  Hypothyroidism   Renal/GU negative Renal ROS  negative genitourinary   Musculoskeletal   Abdominal   Peds  Hematology negative hematology ROS (+)   Anesthesia Other Findings NPO x 4.5 hours.  Emergency case.  Will RSI, OG tube to decompress stomach.  Reproductive/Obstetrics negative OB ROS                             Anesthesia Physical Anesthesia Plan  ASA: 2 and emergent  Anesthesia Plan: General, Cricoid Pressure and Rapid Sequence   Post-op Pain Management:    Induction:   PONV Risk Score and Plan: Ondansetron  Airway Management Planned:   Additional Equipment:   Intra-op Plan:   Post-operative Plan:   Informed Consent: I have reviewed the patients History and Physical, chart, labs and discussed the procedure including the risks, benefits and alternatives for the proposed anesthesia with the patient or authorized representative who has indicated his/her understanding and acceptance.     Dental Advisory Given  Plan Discussed with: CRNA  Anesthesia Plan Comments:         Anesthesia Quick Evaluation

## 2021-08-25 NOTE — ED Provider Notes (Signed)
35 year old male presenting with right-sided abdominal discomfort and bloating that began yesterday, ongoing pain and tenderness with some associated diarrhea, on my exam the patient does have right lower quadrant tenderness, right mid abdominal tenderness, no Murphy sign or right flank tenderness.  He does have a history of a kidney stone in the past but he is definitely objectively tender on my exam.  No tachycardia, denies fevers, denies chills to me.  Will need labs and a CT scan to rule out appendicitis versus kidney stone as possible causes, may be nothing more than intestinal colic however he took some over-the-counter medications yesterday without significant relief.  CT confirms appendicitis  Medical screening examination/treatment/procedure(s) were conducted as a shared visit with non-physician practitioner(s) and myself.  I personally evaluated the patient during the encounter.  Clinical Impression:   Final diagnoses:  Acute appendicitis, unspecified acute appendicitis type         Noemi Chapel, MD 08/26/21 0800

## 2021-08-25 NOTE — Anesthesia Procedure Notes (Addendum)
Procedure Name: Intubation Date/Time: 08/25/2021 4:54 PM Performed by: Louann Sjogren, MD Pre-anesthesia Checklist: Patient identified, Patient being monitored, Timeout performed, Emergency Drugs available and Suction available Patient Re-evaluated:Patient Re-evaluated prior to induction Oxygen Delivery Method: Circle System Utilized Preoxygenation: Pre-oxygenation with 100% oxygen Induction Type: IV induction, Rapid sequence and Cricoid Pressure applied Ventilation: Mask ventilation without difficulty Laryngoscope Size: Mac and 4 Grade View: Grade II Tube type: Oral Tube size: 7.0 mm Number of attempts: 1 Airway Equipment and Method: Stylet Placement Confirmation: ETT inserted through vocal cords under direct vision, positive ETCO2 and breath sounds checked- equal and bilateral Secured at: 21 cm Tube secured with: Tape Dental Injury: Teeth and Oropharynx as per pre-operative assessment

## 2021-08-25 NOTE — Consult Note (Signed)
Reason for Consult: Acute appendicitis Referring Physician: Dr. [name] is an 35 y.o. male.  HPI: Patient is a 35 year old white male who presented to the emergency room with a 24-hour history of worsening right lower quadrant abdominal pain.  CT scan of the abdomen reveals acute appendicitis without perforation.  Patient states he started feeling some epigastric and periumbilical pain yesterday but it has since moved to the right lower quadrant.  Past Medical History:  Diagnosis Date   Acid reflux    Depression    Hypercholesteremia    Hypothyroidism    Vitamin D deficiency     Past Surgical History:  Procedure Laterality Date   UPPER GASTROINTESTINAL ENDOSCOPY  04/2014   [place] with Dr.Panyda/Patel    Family History  Problem Relation Age of Onset   Depression Mother    Hypertension Mother    Atrial fibrillation Mother    Hypothyroidism Father    Hypertension Father    Diabetes Father    Healthy Sister    Healthy Brother    Healthy Son     Social History:  reports that he has never smoked. He has never used smokeless tobacco. He reports current alcohol use. He reports that he does not use drugs.  Allergies:  Allergies  Allergen Reactions   Sulfa Antibiotics Other (See Comments)    Causes a fever     Medications: Prior to Admission: (Not in a hospital admission)   Results for orders placed or performed during the hospital encounter of 08/25/21 (from the past 48 hour(s))  Lipase, blood     Status: None   Collection Time: 08/25/21  1:14 PM  Result Value Ref Range   Lipase 28 11 - 51 U/L    Comment: Performed at [facility], [address], [place], [place] [address]  Comprehensive metabolic panel     Status: Abnormal   Collection Time: 08/25/21  1:14 PM  Result Value Ref Range   Sodium 137 135 - 145 mmol/L   Potassium 3.6 3.5 - 5.1 mmol/L   Chloride 104 98 - 111 mmol/L   CO2 26 22 - 32 mmol/L   Glucose, Bld 91 70 - 99 mg/dL    Comment:  Glucose reference range applies only to samples taken after fasting for at least 8 hours.   BUN 14 6 - 20 mg/dL   Creatinine, Ser 1.27 (H) 0.61 - 1.24 mg/dL   Calcium 9.0 8.9 - 10.3 mg/dL   Total Protein 7.0 6.5 - 8.1 g/dL   Albumin 4.3 3.5 - 5.0 g/dL   AST 21 15 - 41 U/L   ALT 37 0 - 44 U/L   Alkaline Phosphatase 47 38 - 126 U/L   Total Bilirubin 0.7 0.3 - 1.2 mg/dL   GFR, Estimated >60 >60 mL/min    Comment: (NOTE) Calculated using the CKD-EPI Creatinine Equation (2021)    Anion gap 7 5 - 15    Comment: Performed at [facility], [address], [place], [place] [address]  CBC     Status: None   Collection Time: 08/25/21  1:14 PM  Result Value Ref Range   WBC 7.6 4.0 - 10.5 K/uL   RBC 4.67 4.22 - 5.81 MIL/uL   Hemoglobin 13.6 13.0 - 17.0 g/dL   HCT 41.7 39.0 - 52.0 %   MCV 89.3 80.0 - 100.0 fL   MCH 29.1 26.0 - 34.0 pg   MCHC 32.6 30.0 - 36.0 g/dL   RDW 12.1 11.5 - 15.5 %  Platelets 279 150 - 400 K/uL   nRBC 0.0 0.0 - 0.2 %    Comment: Performed at [facility], [address] [place], [place] [address]  Urinalysis, Routine w reflex microscopic     Status: None   Collection Time: 08/25/21  2:02 PM  Result Value Ref Range   Color, Urine YELLOW YELLOW   APPearance CLEAR CLEAR   Specific Gravity, Urine 1.020 1.005 - 1.030   pH 6.5 5.0 - 8.0   Glucose, UA NEGATIVE NEGATIVE mg/dL   Hgb urine dipstick NEGATIVE NEGATIVE   Bilirubin Urine NEGATIVE NEGATIVE   Ketones, ur NEGATIVE NEGATIVE mg/dL   Protein, ur NEGATIVE NEGATIVE mg/dL   Nitrite NEGATIVE NEGATIVE   Leukocytes,Ua NEGATIVE NEGATIVE    Comment: Microscopic not done on urines with negative protein, blood, leukocytes, nitrite, or glucose < 500 mg/dL. Performed at [facility], [address] [place], [place] [address]   Resp Panel by RT-PCR (Flu A&B, Covid) Nasopharyngeal Swab     Status: None   Collection Time: 08/25/21  2:07 PM   Specimen: Nasopharyngeal Swab; Nasopharyngeal(NP) swabs in vial transport medium   Result Value Ref Range   SARS Coronavirus 2 by RT PCR NEGATIVE NEGATIVE    Comment: (NOTE) SARS-CoV-2 target nucleic acids are NOT DETECTED.  The SARS-CoV-2 RNA is generally detectable in upper respiratory specimens during the acute phase of infection. The lowest concentration of SARS-CoV-2 viral copies this assay can detect is 138 copies/mL. A negative result does not preclude SARS-Cov-2 infection and should not be used as the sole basis for treatment or other patient management decisions. A negative result may occur with  improper specimen collection/handling, submission of specimen other than nasopharyngeal swab, presence of viral mutation(s) within the areas targeted by this assay, and inadequate number of viral copies(<138 copies/mL). A negative result must be combined with clinical observations, patient history, and epidemiological information. The expected result is Negative.  Fact Sheet for Patients:  EntrepreneurPulse.com.au  Fact Sheet for Healthcare Providers:  IncredibleEmployment.be  This test is no t yet approved or cleared by the [place] FDA and  has been authorized for detection and/or diagnosis of SARS-CoV-2 by FDA under an Emergency Use Authorization (EUA). This EUA will remain  in effect (meaning this test can be used) for the duration of the COVID-19 declaration under Section 564(b)(1) of the Act, 21 U.S.C.section 360bbb-3(b)(1), unless the authorization is terminated  or revoked sooner.       Influenza A by PCR NEGATIVE NEGATIVE   Influenza B by PCR NEGATIVE NEGATIVE    Comment: (NOTE) The Xpert Xpress SARS-CoV-2/FLU/RSV plus assay is intended as an aid in the diagnosis of influenza from Nasopharyngeal swab specimens and should not be used as a sole basis for treatment. Nasal washings and aspirates are unacceptable for Xpert Xpress SARS-CoV-2/FLU/RSV testing.  Fact Sheet for  Patients: EntrepreneurPulse.com.au  Fact Sheet for Healthcare Providers: IncredibleEmployment.be  This test is not yet approved or cleared by the [place] FDA and has been authorized for detection and/or diagnosis of SARS-CoV-2 by FDA under an Emergency Use Authorization (EUA). This EUA will remain in effect (meaning this test can be used) for the duration of the COVID-19 declaration under Section 564(b)(1) of the Act, 21 U.S.C. section 360bbb-3(b)(1), unless the authorization is terminated or revoked.  Performed at [facility], [address] [place], [place] [address]     CT ABDOMEN PELVIS W CONTRAST  Result Date: 08/25/2021 CLINICAL DATA:  Right lower quadrant abdomen pain. Assess for  appendicitis. EXAM: CT ABDOMEN AND PELVIS WITH CONTRAST TECHNIQUE: Multidetector CT imaging of the abdomen and pelvis was performed using the standard protocol following bolus administration of intravenous contrast. CONTRAST:  67m OMNIPAQUE IOHEXOL 350 MG/ML SOLN COMPARISON:  December 29, 2018 FINDINGS: Lower chest: No acute abnormality. Hepatobiliary: Diffuse low density of the liver without vessel displacement is identified. No focal liver lesion is noted. Gallbladder and biliary tree normal. Pancreas: Unremarkable. No pancreatic ductal dilatation or surrounding inflammatory changes. Spleen: Normal in size without focal abnormality. Adrenals/Urinary Tract: Adrenal glands are unremarkable. Kidneys are normal, without renal calculi, focal lesion, or hydronephrosis. Bladder is unremarkable. Stomach/Bowel: The appendix is enlarged with surrounding inflammation consistent with acute appendicitis. No abscess is noted. There is no small bowel obstruction or diverticulitis. The stomach is normal. Vascular/Lymphatic: No significant vascular findings are present. No enlarged abdominal or pelvic lymph nodes. Reproductive: Prostate is unremarkable. Other: None. Musculoskeletal: No  acute abnormality. IMPRESSION: 1. Findings consistent with acute appendicitis. No abscess is noted. 2. Fatty infiltration of liver. Electronically Signed   By: WAbelardo DieselM.D.   On: 08/25/2021 14:57    ROS:  Pertinent items are noted in HPI.  Blood pressure 137/80, pulse 85, temperature (!) 97.5 F (36.4 C), temperature source Oral, resp. rate (!) 21, height '5\' 11"'$  (1.803 m), weight 99.8 kg, SpO2 96 %. Physical Exam: Well-developed well-nourished white male no acute distress Head is normocephalic, atraumatic Lungs clear to auscultation with good breath sounds bilaterally Heart examination reveals regular rate and rhythm without S3, S4, murmurs Abdomen is soft with tenderness in the right lower quadrant to palpation.  No rigidity is noted. CT scan images personally reviewed  Assessment/Plan: Impression: Acute appendicitis Plan: Patient will be taken to the operating room for laparoscopic appendectomy.  The risks and benefits of the procedure including bleeding, infection, and the possibility of an open procedure were fully explained to the patient, who gave informed consent.  MAviva Signs9/10/2021, 4:25 PM

## 2021-08-25 NOTE — Anesthesia Postprocedure Evaluation (Signed)
Anesthesia Post Note  Patient: William Lucero  Procedure(s) Performed: APPENDECTOMY LAPAROSCOPIC (Abdomen)  Patient location during evaluation: PACU Anesthesia Type: General Level of consciousness: awake and alert Pain management: pain level controlled Vital Signs Assessment: post-procedure vital signs reviewed and stable Respiratory status: spontaneous breathing, nonlabored ventilation, respiratory function stable and patient connected to nasal cannula oxygen Cardiovascular status: blood pressure returned to baseline and stable Postop Assessment: no apparent nausea or vomiting Anesthetic complications: no   No notable events documented.   Last Vitals:  Vitals:   08/25/21 1256 08/25/21 1615  BP: 129/82 137/80  Pulse: 96 85  Resp: 18 (!) 21  Temp: (!) 36.4 C   SpO2: 100% 96%    Last Pain:  Vitals:   08/25/21 1257  TempSrc:   PainSc: Gravity

## 2021-08-25 NOTE — ED Triage Notes (Signed)
Pt c/o RT upper/lower abdominal pain and bloating that began yesterday. Pt also endorses general malaise, chills, and loss of appetite. Denies n/v/d.

## 2021-08-25 NOTE — Progress Notes (Signed)
  August 25, 2021  Patient: William Lucero  Date of Birth: 15-Jun-1986  Date of Visit: 08/25/2021    To Whom It May Concern:  [name] was seen and treated in our emergency department and had surgery on 08/25/2021. [name]  may return to school on Tuesday, 08/27/21 .  Sincerely,    Charm Barges RN [facility] surgical services

## 2021-08-25 NOTE — ED Provider Notes (Signed)
[facility] Provider Note   CSN: [id number] Arrival date & time: 08/25/21  1250     History Chief Complaint  Patient presents with   Abdominal Pain    William Lucero is a 35 y.o. male.  HPI  Patient with significant medical history of acid reflux presents to the emergency department with chief complaint of abdominal tenderness.  Patient states tenderness started yesterday morning, started after he had a bowel movement, states the pain is on the right side of his upper and lower abdomen, worse in his upper abdomen, does not radiate into his back, no flank pain, no associated urinary symptoms, he states he had 1 episode of loose stool, denies diarrhea, constipation, melena, hematochezia, no associated nausea or vomiting, states he has a decrease in appetite, and states that he just does not feel well.  He has no associated fevers, chills, nasal congestion, sore throat, cough, chest pain, general body aches, denies recent sick contacts, is up-to-date on his COVID-vaccine.  He denies eating abnormal foods, states no one else in his household is sick, has had a kidney stone in the past but states this feels differently, has no history of diverticulitis, pancreatitis, bowel obstruction, no other significant abdominal history.  Patient had a right upper quadrant ultrasound performed 2 years ago and shows liver steatosis no gallbladder abnormalities.  Patient denies recent alcohol use, denies history of NSAIDs, no history of peptic ulcer disease.  Patient states eating and laying down tends to make the pain worse, sitting up improves the pain.    Past Medical History:  Diagnosis Date   Acid reflux    Depression    Hypercholesteremia    Hypothyroidism    Vitamin D deficiency     Patient Active Problem List   Diagnosis Date Noted   Acute appendicitis with localized peritonitis, without perforation, abscess, or gangrene    Fatigue 04/13/2020   Hashimoto's thyroiditis  04/12/2020   Hypogonadism in male 04/12/2020   BMI 31.0-31.9,adult 04/12/2020   Mixed hyperlipidemia 05/24/2019   Hypogonadism, male 05/05/2019   Hypothyroidism 05/05/2019    Past Surgical History:  Procedure Laterality Date   UPPER GASTROINTESTINAL ENDOSCOPY  04/2014   [place] with Dr.Panyda/Patel       Family History  Problem Relation Age of Onset   Depression Mother    Hypertension Mother    Atrial fibrillation Mother    Hypothyroidism Father    Hypertension Father    Diabetes Father    Healthy Sister    Healthy Brother    Healthy Son     Social History   Tobacco Use   Smoking status: Never   Smokeless tobacco: Never  Vaping Use   Vaping Use: Never used  Substance Use Topics   Alcohol use: Yes    Comment: socially   Drug use: Never    Home Medications Prior to Admission medications   Medication Sig Start Date End Date Taking? Authorizing Provider  buPROPion (WELLBUTRIN XL) 300 MG 24 hr tablet Take 300 mg by mouth daily.   Yes [provider]  Cholecalciferol (VITAMIN D3) 125 MCG (5000 UT) TABS Take 5,000 Units by mouth daily.   Yes [provider]  fenofibrate micronized (LOFIBRA) 134 MG capsule Take 134 mg by mouth daily. 08/20/21  Yes [provider]  levothyroxine (SYNTHROID) 137 MCG tablet Take by mouth. 06/07/20  Yes [provider]  Multiple Vitamin (MULTIVITAMIN WITH MINERALS) TABS tablet Take 1 tablet by mouth daily.   Yes [provider]  omeprazole (PRILOSEC) 20 MG capsule Take 20 mg by mouth daily.   Yes [provider]    Allergies    Sulfa antibiotics  Review of Systems   Review of Systems  Constitutional:  Positive for appetite change. Negative for chills and fever.  HENT:  Negative for congestion.   Respiratory:  Negative for shortness of breath.   Cardiovascular:  Negative for chest pain.  Gastrointestinal:  Positive for abdominal pain. Negative for diarrhea, nausea and vomiting.   Genitourinary:  Negative for enuresis and flank pain.  Musculoskeletal:  Negative for back pain.  Skin:  Negative for rash.  Neurological:  Negative for dizziness and headaches.  Hematological:  Does not bruise/bleed easily.   Physical Exam Updated Vital Signs BP 137/80   Pulse 85   Temp (!) 97.5 F (36.4 C) (Oral)   Resp (!) 21   Ht '5\' 11"'  (1.803 m)   Wt 99.8 kg   SpO2 96%   BMI 30.68 kg/m   Physical Exam Vitals and nursing note reviewed.  Constitutional:      General: He is not in acute distress.    Appearance: He is not ill-appearing.  HENT:     Head: Normocephalic and atraumatic.     Nose: No congestion.  Eyes:     Conjunctiva/sclera: Conjunctivae normal.  Cardiovascular:     Rate and Rhythm: Normal rate and regular rhythm.     Pulses: Normal pulses.     Heart sounds: No murmur heard.   No friction rub. No gallop.  Pulmonary:     Effort: No respiratory distress.     Breath sounds: No wheezing, rhonchi or rales.  Abdominal:     Palpations: Abdomen is soft.     Tenderness: There is abdominal tenderness. There is no right CVA tenderness or left CVA tenderness.     Comments: Abdomen nondistended, normal bowel sounds, dull to percussion, tenderness to palpation mid lateral aspect of his right abdomen, no guarding, rebound tenderness, peritoneal sign, negative Murphy sign, McBurney point, no CVA tenderness.  Musculoskeletal:     Right lower leg: No edema.     Left lower leg: No edema.  Skin:    General: Skin is warm and dry.  Neurological:     Mental Status: He is alert.  Psychiatric:        Mood and Affect: Mood normal.    ED Results / Procedures / Treatments   Labs (all labs ordered are listed, but only abnormal results are displayed) Labs Reviewed  COMPREHENSIVE METABOLIC PANEL - Abnormal; Notable for the following components:      Result Value   Creatinine, Ser 1.27 (*)    All other components within normal limits  RESP PANEL BY RT-PCR (FLU A&B, COVID)  ARPGX2  LIPASE, BLOOD  CBC  URINALYSIS, ROUTINE W REFLEX MICROSCOPIC  SURGICAL PATHOLOGY    EKG None  Radiology CT ABDOMEN PELVIS W CONTRAST  Result Date: 08/25/2021 CLINICAL DATA:  Right lower quadrant abdomen pain. Assess for appendicitis. EXAM: CT ABDOMEN AND PELVIS WITH CONTRAST TECHNIQUE: Multidetector CT imaging of the abdomen and pelvis was performed using the standard protocol following bolus administration of intravenous contrast. CONTRAST:  77m OMNIPAQUE IOHEXOL 350 MG/ML SOLN COMPARISON:  December 29, 2018 FINDINGS: Lower chest: No acute abnormality. Hepatobiliary: Diffuse low density of the liver without vessel displacement is identified. No focal liver lesion is noted. Gallbladder and biliary tree normal. Pancreas: Unremarkable. No pancreatic ductal dilatation or surrounding inflammatory changes. Spleen: Normal  in size without focal abnormality. Adrenals/Urinary Tract: Adrenal glands are unremarkable. Kidneys are normal, without renal calculi, focal lesion, or hydronephrosis. Bladder is unremarkable. Stomach/Bowel: The appendix is enlarged with surrounding inflammation consistent with acute appendicitis. No abscess is noted. There is no small bowel obstruction or diverticulitis. The stomach is normal. Vascular/Lymphatic: No significant vascular findings are present. No enlarged abdominal or pelvic lymph nodes. Reproductive: Prostate is unremarkable. Other: None. Musculoskeletal: No acute abnormality. IMPRESSION: 1. Findings consistent with acute appendicitis. No abscess is noted. 2. Fatty infiltration of liver. Electronically Signed   By: Abelardo Diesel M.D.   On: 08/25/2021 14:57    Procedures Procedures   Medications Ordered in ED Medications  sodium chloride irrigation 0.9 % (1,000 mLs  Given 08/25/21 1720)  bupivacaine liposome (EXPAREL) 1.3 % injection (20 mLs Infiltration Given 08/25/21 1729)  dicyclomine (BENTYL) injection 20 mg (20 mg Intramuscular Given 08/25/21 1350)  alum &  mag hydroxide-simeth (MAALOX/MYLANTA) 200-200-20 MG/5ML suspension 30 mL (30 mLs Oral Given 08/25/21 1344)    And  lidocaine (XYLOCAINE) 2 % viscous mouth solution 15 mL (15 mLs Oral Given 08/25/21 1344)  iohexol (OMNIPAQUE) 350 MG/ML injection 100 mL (75 mLs Intravenous Contrast Given 08/25/21 1411)  sucralfate (CARAFATE) 1 GM/10ML suspension 1 g (1 g Oral Given 08/25/21 1446)  piperacillin-tazobactam (ZOSYN) IVPB 3.375 g (0 g Intravenous Stopped 08/25/21 1618)    ED Course  I have reviewed the triage vital signs and the nursing notes.  Pertinent labs & imaging results that were available during my care of the patient were reviewed by me and considered in my medical decision making (see chart for details).    MDM Rules/Calculators/A&P                          Initial impression-patient presents with abdominal tenderness.  He is alert, does not appear acute distress, vital signs reassuring.  Will obtain basic lab work-up, add on a COVID test, provide patient with GI cocktail Bentyl and reassess.  Work-up-CBC unremarkable, CMP shows slight increase in creatinine 1.27, lipase is 28.  UA is unremarkable.  Reassessment-patient was reassessed after GI cocktail and Bentyl, states he is feeling much better, abdomen was palpated still slightly tender to palpation in the right middle aspect of his abdomen but not as severe as prior.  We will also try some Carafate as well and continue to assess.  CT imaging reveals acute uncomplicated appendicitis, will consult with general surgery for further recommendations.  Patient notes that he had a taco around 12 PM today.  Updated patient on recommendations from general surgery he is agreement this plan.  Consult-spoke with Dr. Arnoldo Morale of general surgery he will bring the patient to the OR today, recommend start patient on Zosyn new come down and assessed the patient.  Rule out-I have low suspicion for bowel obstruction as abdomen is nondistended dull to  percussion, patient still passing gas and having bowel movements.  I have low suspicion for liver or gallbladder abnormalities as he has no increase in liver enzymes, alk phos, T bili.  I have low suspicion for complicated diverticulitis as he is nontoxic-appearing, no leukocytosis, no peritoneal sign present.  Low suspicion for pancreatitis as lipase is within normal limits.  Low suspicion for UTI, pyelonephritis, kidney stone patient denies urinary symptoms, has no CVA tenderness, UA is negative for signs infection or hematuria.  Plan-admit to surgery as he will proceed with appendicectomy later today.  Final Clinical Impression(s) /  ED Diagnoses Final diagnoses:  Acute appendicitis, unspecified acute appendicitis type    Rx / DC Orders ED Discharge Orders     None        Aron Baba 08/25/21 1738    Noemi Chapel, MD 08/26/21 0800

## 2021-08-25 NOTE — Op Note (Signed)
Patient:  William Lucero  DOB:  04-11-1986  MRN:  [id number]   Preop Diagnosis: Acute appendicitis  Postop Diagnosis: Same  Procedure: Laparoscopic appendectomy  Surgeon: Aviva Signs, MD  Anes: General endotracheal  Indications: Patient is a 35 year old white male who presents with a 24-hour history of worsening right lower quadrant abdominal pain.  CT scan of the abdomen reveals acute appendicitis.  The risks and benefits of the procedure including bleeding, infection, and the possibility of an open procedure were fully explained to the patient, who gave informed consent.  Procedure note: The patient was placed in supine position.  After induction of general endotracheal anesthesia, the abdomen was prepped and draped using the usual sterile technique with ChloraPrep.  Surgical site confirmation was performed.  A supraumbilical incision was made down to the fascia.  A Veress needle was introduced into the abdominal cavity and confirmation of placement was done using the saline drop test.  The abdomen was then insufflated to 15 mmHg pressure.  An 11 mm trocar was introduced into the abdominal cavity under direct visualization without difficulty.  The patient was placed in deeper Trendelenburg position and a 12 mm trocar was placed in the suprapubic region and a 5 mm trocar was placed in the left lower quadrant region.  The appendix was visualized.  The mesoappendix was divided using the harmonic scalpel.  The distal two thirds of the appendix was noted to be inflamed.  The juncture of the appendix to the cecum was fully visualized.  A vascular Endo GIA was placed across the base the appendix and fired.  The appendix was then removed using an Endo Catch bag without difficulty.  The staple line was inspected and was within normal limits.  No abnormal bleeding was noted.  All fluid and air were then evacuated from the abdominal cavity prior to removal of the trochars.  All wounds were irrigated with  normal saline.  All wounds were injected with Exparel.  The supraumbilical fascia was reapproximated using an 0 Vicryl interrupted suture.  All skin incisions were closed using a 4-0 Monocryl subcuticular suture.  Dermabond was applied.  All tape and needle counts were correct at the end of the procedure.  The patient was extubated in the operating room and transferred to PACU in stable condition.  Complications: None  EBL: Minimal  Specimen: Appendix

## 2021-08-26 ENCOUNTER — Encounter (HOSPITAL_COMMUNITY): Payer: Self-pay | Admitting: General Surgery

## 2021-08-27 LAB — SURGICAL PATHOLOGY

## 2021-09-05 ENCOUNTER — Telehealth (INDEPENDENT_AMBULATORY_CARE_PROVIDER_SITE_OTHER): Payer: BC Managed Care – PPO | Admitting: General Surgery

## 2021-09-05 DIAGNOSIS — Z09 Encounter for follow-up examination after completed treatment for conditions other than malignant neoplasm: Secondary | ICD-10-CM

## 2021-09-05 NOTE — Telephone Encounter (Signed)
Postoperative telephone visit performed with patient.  States he is doing well and is back at school.  He did receive a note from my office while he was out.  I told him to call me should he have any problems arise.  As this was a part of the global surgical fee, this was not a billable visit.  Telephone time was 1 minute.

## 2021-10-25 ENCOUNTER — Ambulatory Visit
Admission: EM | Admit: 2021-10-25 | Discharge: 2021-10-25 | Disposition: A | Discharge status: Home / Self Care | Payer: BC Managed Care – PPO | Attending: Emergency Medicine | Admitting: Emergency Medicine

## 2021-10-25 ENCOUNTER — Other Ambulatory Visit: Payer: Self-pay

## 2021-10-25 DIAGNOSIS — J029 Acute pharyngitis, unspecified: Secondary | ICD-10-CM

## 2021-10-25 LAB — POCT RAPID STREP A (OFFICE): Rapid Strep A Screen: NEGATIVE

## 2021-10-25 LAB — POCT INFLUENZA A/B
Influenza A, POC: NEGATIVE
Influenza B, POC: NEGATIVE

## 2021-10-25 MED ORDER — FLUTICASONE PROPIONATE 50 MCG/ACT NA SUSP: 2.0000 | Freq: Every day | NASAL | 0 refills | Status: AC

## 2021-10-25 MED ORDER — IBUPROFEN 600 MG PO TABS: 600.0000 mg | ORAL_TABLET | Freq: Four times a day (QID) | ORAL | 0 refills | Status: AC | PRN

## 2021-10-25 NOTE — ED Triage Notes (Signed)
Patient presents to Urgent Care with complaints of sore throat, congestion, and fatigue since yesterday. Treating symptoms with ibuprofen.   Denies fever.

## 2021-10-25 NOTE — Discharge Instructions (Addendum)
your rapid flu and strep were negative today, so we have sent off a throat culture.  We will contact you and call in the appropriate antibiotics if your culture comes back positive for an infection requiring antibiotic treatment.  Give [place] a working phone number.  If you were given a prescription for antibiotics, you may want to wait and fill it until you know the results of the culture.  1 gram of Tylenol and 600 mg ibuprofen together 3-4 times a day as needed for pain.  Make sure you drink plenty of extra fluids.  Some people find salt water gargles and  Traditional Medicinal's "Throat Coat" tea helpful. Take 5 mL of liquid Benadryl and 5 mL of Maalox. Mix it together, and then hold it in your mouth for as long as you can and then swallow. You may do this 4 times a day.  Flonase, saline nasal irrigation with a Milta Deiters Med rinse and distilled water as often as you want, Mucinex D for nasal congestion.  Go to www.goodrx.com  or www.costplusdrugs.com to look up your medications. This will give you a list of where you can find your prescriptions at the most affordable prices. Or ask the pharmacist what the cash price is, or if they have any other discount programs available to help make your medication more affordable. This can be less expensive than what you would pay with insurance.

## 2021-10-25 NOTE — ED Provider Notes (Signed)
HPI  SUBJECTIVE:  Patient reports sore throat, fatigue, decreased appetite starting yesterday.  Symptoms worse as the day goes on and with activity.  He has been taking ibuprofen 400 mg with improvement in his symptoms. No fever  No neck stiffness  No Cough + nasal congestion, postnasal drip.  No rhinorrhea + Myalgias + Headache No Rash  No loss of taste or smell No shortness of breath or difficulty breathing + nausea, no vomiting No diarrhea No abdominal pain     No Recent Strep, flu exposure.  His kids had nasal congestion and fevers last week, but did not receive medical attention. He got the flu vaccine. No reflux sxs No Allergy sxs  No Breathing difficulty, voice changes, sensation of throat swelling shut No Drooling No Trismus No abx in past month.  No antipyretic in past 4-6 hrs  Has a past medical history of COVID in August 22, GERD, hypothyroidism. PMD: [facility] in [place]   Past Medical History:  Diagnosis Date   Acid reflux    Depression    Hypercholesteremia    Hypothyroidism    Vitamin D deficiency     Past Surgical History:  Procedure Laterality Date   LAPAROSCOPIC APPENDECTOMY N/A 08/25/2021   Procedure: APPENDECTOMY LAPAROSCOPIC;  Surgeon: Aviva Signs, MD;  Location: [facility];  Service: General;  Laterality: N/A;   UPPER GASTROINTESTINAL ENDOSCOPY  04/2014   [place] with Dr.Panyda/Patel    Family History  Problem Relation Age of Onset   Depression Mother    Hypertension Mother    Atrial fibrillation Mother    Hypothyroidism Father    Hypertension Father    Diabetes Father    Healthy Sister    Healthy Brother    Healthy Son     Social History   Tobacco Use   Smoking status: Never   Smokeless tobacco: Never  Vaping Use   Vaping Use: Never used  Substance Use Topics   Alcohol use: Yes    Comment: socially   Drug use: Never    No current facility-administered medications for this  encounter.  Current Outpatient Medications:    fluticasone (FLONASE) 50 MCG/ACT nasal spray, Place 2 sprays into both nostrils daily., Disp: 16 g, Rfl: 0   ibuprofen (ADVIL) 600 MG tablet, Take 1 tablet (600 mg total) by mouth every 6 (six) hours as needed., Disp: 30 tablet, Rfl: 0   buPROPion (WELLBUTRIN XL) 300 MG 24 hr tablet, Take 300 mg by mouth daily., Disp: , Rfl:    Cholecalciferol (VITAMIN D3) 125 MCG (5000 UT) TABS, Take 5,000 Units by mouth daily., Disp: , Rfl:    fenofibrate micronized (LOFIBRA) 134 MG capsule, Take 134 mg by mouth daily., Disp: , Rfl:    levothyroxine (SYNTHROID) 137 MCG tablet, Take by mouth., Disp: , Rfl:    Multiple Vitamin (MULTIVITAMIN WITH MINERALS) TABS tablet, Take 1 tablet by mouth daily., Disp: , Rfl:    omeprazole (PRILOSEC) 20 MG capsule, Take 20 mg by mouth daily., Disp: , Rfl:   Allergies  Allergen Reactions   Sulfa Antibiotics Other (See Comments)    Causes a fever      ROS  As noted in HPI.   Physical Exam  BP (!) 157/89 (BP Location: Right Arm)   Pulse (!) 105   Temp 98.5 F (36.9 C) (Oral)   Resp 16   SpO2 98%   Constitutional: Well developed, well nourished, no acute distress Eyes:  EOMI, conjunctiva normal bilaterally HENT: Normocephalic, atraumatic,mucus  membranes moist. +  nasal congestion + erythematous oropharynx - enlarged tonsils - exudates. Uvula midline.  Respiratory: Normal inspiratory effort Cardiovascular: Regular tachycardia, no murmurs, rubs, gallops GI: nondistended, nontender. No appreciable splenomegaly skin: No rash, skin intact Lymph: -  Anterior cervical LN.  No posterior cervical lymphadenopathy Musculoskeletal: no deformities Neurologic: Alert & oriented x 3, no focal neuro deficits Psychiatric: Speech and behavior appropriate.  ED Course   Medications - No data to display  Orders Placed This Encounter  Procedures   Culture, group A strep    Standing Status:   Standing    Number of  Occurrences:   1   POCT Influenza A/B    Standing Status:   Standing    Number of Occurrences:   1   POCT rapid strep A    Standing Status:   Standing    Number of Occurrences:   1    No results found for this or any previous visit (from the past 24 hour(s)).  No results found.  ED Clinical Impression  1. Acute pharyngitis, unspecified etiology    ED Assessment/Plan   Rapid flu, strep negative. Obtaining throat culture to guide antibiotic treatment. Discussed this with patient. We'll contact him if culture is positive, and will call in Appropriate antibiotics. Patient home with ibuprofen, Tylenol, Benadryl/Maalox mixture, Flonase, saline nasal irrigation.  Deferred COVID testing because he had COVID less than 3 months ago.  Patient to followup with PMD when necessary,   Discussed labs,  MDM, plan and followup with patient. Discussed sn/sx that should prompt return to the ED. patient agrees with plan.   Meds ordered this encounter  Medications   fluticasone (FLONASE) 50 MCG/ACT nasal spray    Sig: Place 2 sprays into both nostrils daily.    Dispense:  16 g    Refill:  0   ibuprofen (ADVIL) 600 MG tablet    Sig: Take 1 tablet (600 mg total) by mouth every 6 (six) hours as needed.    Dispense:  30 tablet    Refill:  0     *This clinic note was created using [employer]. Therefore, there may be occasional mistakes despite careful proofreading.     Melynda Ripple, MD 10/27/21 1441

## 2021-10-28 LAB — CULTURE, GROUP A STREP (THRC)

## 2022-06-19 DIAGNOSIS — R5383 Other fatigue: Secondary | ICD-10-CM | POA: Diagnosis not present

## 2022-06-19 DIAGNOSIS — E039 Hypothyroidism, unspecified: Secondary | ICD-10-CM | POA: Diagnosis not present

## 2022-06-19 DIAGNOSIS — R739 Hyperglycemia, unspecified: Secondary | ICD-10-CM | POA: Diagnosis not present

## 2022-06-19 DIAGNOSIS — E785 Hyperlipidemia, unspecified: Secondary | ICD-10-CM | POA: Diagnosis not present

## 2023-02-12 ENCOUNTER — Encounter: Payer: Self-pay | Admitting: Radiology

## 2023-03-09 DIAGNOSIS — Z111 Encounter for screening for respiratory tuberculosis: Secondary | ICD-10-CM | POA: Diagnosis not present
